# Patient Record
Sex: Female | Born: 1975 | Race: Black or African American | Hispanic: No | Smoking: Never smoker
Health system: Southern US, Community
[De-identification: ages and names within clinical notes are randomized; demographics above are authoritative.]

## PROBLEM LIST (undated history)

## (undated) DIAGNOSIS — E559 Vitamin D deficiency, unspecified: Secondary | ICD-10-CM

## (undated) DIAGNOSIS — I10 Essential (primary) hypertension: Secondary | ICD-10-CM

## (undated) DIAGNOSIS — D509 Iron deficiency anemia, unspecified: Secondary | ICD-10-CM

## (undated) DIAGNOSIS — B001 Herpesviral vesicular dermatitis: Secondary | ICD-10-CM

## (undated) HISTORY — DX: Vitamin D deficiency, unspecified: E55.9

## (undated) HISTORY — DX: Herpesviral vesicular dermatitis: B00.1

## (undated) HISTORY — DX: Iron deficiency anemia, unspecified: D50.9

## (undated) HISTORY — PX: ROOT CANAL: SHX2363

---

## 1997-12-23 HISTORY — PX: BREAST EXCISIONAL BIOPSY: SUR124

## 2011-10-08 DIAGNOSIS — B009 Herpesviral infection, unspecified: Secondary | ICD-10-CM | POA: Insufficient documentation

## 2012-10-22 DIAGNOSIS — D649 Anemia, unspecified: Secondary | ICD-10-CM | POA: Insufficient documentation

## 2012-11-02 DIAGNOSIS — N63 Unspecified lump in unspecified breast: Secondary | ICD-10-CM | POA: Insufficient documentation

## 2013-04-19 DIAGNOSIS — I10 Essential (primary) hypertension: Secondary | ICD-10-CM | POA: Insufficient documentation

## 2015-10-11 LAB — HM PAP SMEAR: HM PAP: NEGATIVE

## 2015-10-31 ENCOUNTER — Other Ambulatory Visit: Payer: Self-pay | Admitting: Obstetrics and Gynecology

## 2015-10-31 DIAGNOSIS — N6310 Unspecified lump in the right breast, unspecified quadrant: Secondary | ICD-10-CM

## 2015-11-01 ENCOUNTER — Other Ambulatory Visit: Payer: Self-pay | Admitting: *Deleted

## 2015-11-01 ENCOUNTER — Inpatient Hospital Stay
Admission: RE | Admit: 2015-11-01 | Discharge: 2015-11-01 | Disposition: A | Discharge status: Home / Self Care | Payer: Self-pay | Source: Ambulatory Visit | Attending: *Deleted | Admitting: *Deleted

## 2015-11-01 DIAGNOSIS — Z9289 Personal history of other medical treatment: Secondary | ICD-10-CM

## 2015-11-20 ENCOUNTER — Other Ambulatory Visit: Payer: Self-pay | Admitting: Obstetrics and Gynecology

## 2015-11-20 ENCOUNTER — Ambulatory Visit
Admission: RE | Admit: 2015-11-20 | Discharge: 2015-11-20 | Disposition: A | Discharge status: Home / Self Care | Payer: Managed Care, Other (non HMO) | Source: Ambulatory Visit | Attending: Obstetrics and Gynecology | Admitting: Obstetrics and Gynecology

## 2015-11-20 DIAGNOSIS — N63 Unspecified lump in breast: Secondary | ICD-10-CM | POA: Diagnosis present

## 2015-11-20 DIAGNOSIS — N6310 Unspecified lump in the right breast, unspecified quadrant: Secondary | ICD-10-CM

## 2015-11-21 ENCOUNTER — Other Ambulatory Visit: Payer: Self-pay | Admitting: Obstetrics and Gynecology

## 2015-11-21 DIAGNOSIS — N63 Unspecified lump in unspecified breast: Secondary | ICD-10-CM

## 2015-11-28 ENCOUNTER — Ambulatory Visit
Admission: RE | Admit: 2015-11-28 | Discharge: 2015-11-28 | Disposition: A | Discharge status: Home / Self Care | Payer: Managed Care, Other (non HMO) | Source: Ambulatory Visit | Attending: Obstetrics and Gynecology | Admitting: Obstetrics and Gynecology

## 2015-11-28 DIAGNOSIS — N63 Unspecified lump in unspecified breast: Secondary | ICD-10-CM

## 2015-11-28 HISTORY — PX: BREAST BIOPSY: SHX20

## 2015-11-29 LAB — SURGICAL PATHOLOGY

## 2015-12-05 ENCOUNTER — Other Ambulatory Visit: Payer: Self-pay | Admitting: Obstetrics and Gynecology

## 2015-12-05 DIAGNOSIS — N63 Unspecified lump in unspecified breast: Secondary | ICD-10-CM

## 2016-02-12 NOTE — Addendum Note (Signed)
Encounter addended by: Coral Spikes, Rad Tech on: 02/12/2016  4:50 PM<BR>     Documentation filed: Charges VN

## 2016-06-04 ENCOUNTER — Inpatient Hospital Stay: Admission: RE | Admit: 2016-06-04 | Payer: Managed Care, Other (non HMO) | Source: Ambulatory Visit

## 2016-06-04 ENCOUNTER — Ambulatory Visit: Admission: RE | Admit: 2016-06-04 | Payer: Managed Care, Other (non HMO) | Source: Ambulatory Visit

## 2016-06-24 ENCOUNTER — Emergency Department
Admission: EM | Admit: 2016-06-24 | Discharge: 2016-06-24 | Disposition: A | Discharge status: Home / Self Care | Payer: Managed Care, Other (non HMO) | Attending: Emergency Medicine | Admitting: Emergency Medicine

## 2016-06-24 ENCOUNTER — Encounter: Payer: Self-pay | Admitting: Emergency Medicine

## 2016-06-24 DIAGNOSIS — W010XXA Fall on same level from slipping, tripping and stumbling without subsequent striking against object, initial encounter: Secondary | ICD-10-CM | POA: Insufficient documentation

## 2016-06-24 DIAGNOSIS — Y9301 Activity, walking, marching and hiking: Secondary | ICD-10-CM | POA: Diagnosis not present

## 2016-06-24 DIAGNOSIS — W268XXA Contact with other sharp object(s), not elsewhere classified, initial encounter: Secondary | ICD-10-CM | POA: Insufficient documentation

## 2016-06-24 DIAGNOSIS — I10 Essential (primary) hypertension: Secondary | ICD-10-CM | POA: Insufficient documentation

## 2016-06-24 DIAGNOSIS — Y92009 Unspecified place in unspecified non-institutional (private) residence as the place of occurrence of the external cause: Secondary | ICD-10-CM | POA: Insufficient documentation

## 2016-06-24 DIAGNOSIS — S81812A Laceration without foreign body, left lower leg, initial encounter: Secondary | ICD-10-CM | POA: Diagnosis not present

## 2016-06-24 DIAGNOSIS — Y999 Unspecified external cause status: Secondary | ICD-10-CM | POA: Insufficient documentation

## 2016-06-24 HISTORY — DX: Essential (primary) hypertension: I10

## 2016-06-24 MED ORDER — CEPHALEXIN 500 MG PO CAPS: 500.0000 mg | ORAL_CAPSULE | Freq: Three times a day (TID) | ORAL | Status: AC

## 2016-06-24 MED ORDER — LIDOCAINE-EPINEPHRINE (PF) 1 %-1:200000 IJ SOLN
10.0000 mL | Freq: Once | INTRAMUSCULAR | Status: AC
  Administered 2016-06-24: 10 mL via INTRADERMAL
  Filled 2016-06-24: qty 30

## 2016-06-24 MED ORDER — TETANUS-DIPHTH-ACELL PERTUSSIS 5-2.5-18.5 LF-MCG/0.5 IM SUSP
0.5000 mL | Freq: Once | INTRAMUSCULAR | Status: AC
  Administered 2016-06-24: 0.5 mL via INTRAMUSCULAR
  Filled 2016-06-24: qty 0.5

## 2016-06-24 NOTE — ED Provider Notes (Signed)
CSN: [id number]     Arrival date & time 06/24/16  2031 History   First MD Initiated Contact with Patient 06/24/16 2146     No chief complaint on file.    (Consider location/radiation/quality/duration/timing/severity/associated sxs/prior Treatment) HPI  40 year old female presents to restart for evaluation of laceration to the left leg. Patient states just prior to arrival she was walking in her house when she cut her leg on the blinds. Her tetanus is not up-to-date. She has been ambulatory. Her pain is mild. Bleeding well controlled. She is not diabetic. She denies any numbness or tingling. She has not had any medications for pain.   Past Medical History  Diagnosis Date  . Hypertension    Past Surgical History  Procedure Laterality Date  . Breast excisional biopsy Left 1999    benign  . Breast biopsy Right 11/28/2015    path pending   Family History  Problem Relation Age of Onset  . Breast cancer Mother 29  . Breast cancer Maternal Aunt    Social History  Substance Use Topics  . Smoking status: Never Smoker   . Smokeless tobacco: Never Used  . Alcohol Use: No   OB History    No data available     Review of Systems  Constitutional: Negative for fever, chills, activity change and fatigue.  HENT: Negative for congestion, sinus pressure and sore throat.   Eyes: Negative for visual disturbance.  Respiratory: Negative for cough, chest tightness and shortness of breath.   Cardiovascular: Negative for chest pain and leg swelling.  Gastrointestinal: Negative for nausea, vomiting, abdominal pain and diarrhea.  Genitourinary: Negative for dysuria.  Musculoskeletal: Negative for arthralgias and gait problem.  Skin: Positive for wound. Negative for rash.  Neurological: Negative for weakness, numbness and headaches.  Hematological: Negative for adenopathy.  Psychiatric/Behavioral: Negative for behavioral problems, confusion and agitation.      Allergies  Review of patient's  allergies indicates no known allergies.  Home Medications   Prior to Admission medications   Medication Sig Start Date End Date Taking? Authorizing Provider  cephALEXin (KEFLEX) 500 MG capsule Take 1 capsule (500 mg total) by mouth 3 (three) times daily. 06/24/16 07/04/16  Duanne Guess, PA-C   BP 134/91 mmHg  Pulse 67  Temp(Src) 98.9 F (37.2 C) (Oral)  Ht 5\' 2"  (1.575 m)  Wt 89.359 kg  BMI 36.02 kg/m2  SpO2 99%  LMP 06/20/2016 Physical Exam  Constitutional: She is oriented to person, place, and time. She appears well-developed and well-nourished. No distress.  HENT:  Head: Normocephalic and atraumatic.  Mouth/Throat: Oropharynx is clear and moist.  Eyes: EOM are normal. Pupils are equal, round, and reactive to light. Right eye exhibits no discharge. Left eye exhibits no discharge.  Neck: Normal range of motion. Neck supple.  Cardiovascular: Normal rate, regular rhythm and intact distal pulses.   Pulmonary/Chest: Effort normal and breath sounds normal. No respiratory distress. She exhibits no tenderness.  Abdominal: Soft. She exhibits no distension. There is no tenderness.  Musculoskeletal: Normal range of motion. She exhibits no edema or tenderness.  Examination of the left leg shows the mid lateral tib-fib region there is a 5 cm laceration that is linear with no sign of foreign body. Wound is thoroughly irrigated. Wound margins well aligned. Patient has no tendon or neurological deficits in the left lower extremity. Sensation is intact.  Neurological: She is alert and oriented to person, place, and time. She has normal reflexes.  Skin: Skin is warm and  dry.  Psychiatric: She has a normal mood and affect. Her behavior is normal. Thought content normal.    ED Course  Procedures (including critical care time) LACERATION REPAIR Performed by: Feliberto Gottron Authorized by: Feliberto Gottron Consent: Verbal consent obtained. Risks and benefits: risks, benefits  and alternatives were discussed Consent given by: patient Patient identity confirmed: provided demographic data Prepped and Draped in normal sterile fashion Wound explored  Laceration Location: Left lateral leg  Laceration Length: 5 cm  No Foreign Bodies seen or palpated  Anesthesia: local infiltration  Local anesthetic: lidocaine 1% % with epinephrine  Anesthetic total: 4 ml  Irrigation method: syringe Amount of cleaning: standard  Skin closure: Simple interrupted   Number of sutures: 12   Technique: Simple interrupted 6-0 proline   Patient tolerance: Patient tolerated the procedure well with no immediate complications. Labs Review Labs Reviewed - No data to display  Imaging Review No results found. I have personally reviewed and evaluated these images and lab results as part of my medical decision-making.   EKG Interpretation None      MDM   Final diagnoses:  Leg laceration, left, initial encounter    40 year old female with left leg laceration. No tendon or neurological deficits. No sign of foreign body. Tetanus was updated in the emergency department, laceration was thoroughly irrigated and repaired with #12 6-0 Prolene sutures. Keflex for antibiotic prophylaxis. She will keep clean and dry. Follow-up in 8-10 days for suture removal.    Duanne Guess, PA-C 06/24/16 2254  Orbie Pyo, MD 06/25/16 [id number]

## 2016-06-24 NOTE — Discharge Instructions (Signed)
Laceration Care, Adult  A laceration is a cut that goes through all layers of the skin. The cut also goes into the tissue that is right under the skin. Some cuts heal on their own. Others need to be closed with stitches (sutures), staples, skin adhesive strips, or wound glue. Taking care of your cut lowers your risk of infection and helps your cut to heal better.  HOW TO TAKE CARE OF YOUR CUT  For stitches or staples:  · Keep the wound clean and dry.  · If you were given a bandage (dressing), you should change it at least one time per day or as told by your doctor. You should also change it if it gets wet or dirty.  · Keep the wound completely dry for the first 24 hours or as told by your doctor. After that time, you may take a shower or a bath. However, make sure that the wound is not soaked in water until after the stitches or staples have been removed.  · Clean the wound one time each day or as told by your doctor:    Wash the wound with soap and water.    Rinse the wound with water until all of the soap comes off.    Pat the wound dry with a clean towel. Do not rub the wound.  · After you clean the wound, put a thin layer of antibiotic ointment on it as told by your doctor. This ointment:    Helps to prevent infection.    Keeps the bandage from sticking to the wound.  · Have your stitches or staples removed as told by your doctor.  If your doctor used skin adhesive strips:   · Keep the wound clean and dry.  · If you were given a bandage, you should change it at least one time per day or as told by your doctor. You should also change it if it gets dirty or wet.  · Do not get the skin adhesive strips wet. You can take a shower or a bath, but be careful to keep the wound dry.  · If the wound gets wet, pat it dry with a clean towel. Do not rub the wound.  · Skin adhesive strips fall off on their own. You can trim the strips as the wound heals. Do not remove any strips that are still stuck to the wound. They will  fall off after a while.  If your doctor used wound glue:  · Try to keep your wound dry, but you may briefly wet it in the shower or bath. Do not soak the wound in water, such as by swimming.  · After you take a shower or a bath, gently pat the wound dry with a clean towel. Do not rub the wound.  · Do not do any activities that will make you really sweaty until the skin glue has fallen off on its own.  · Do not apply liquid, cream, or ointment medicine to your wound while the skin glue is still on.  · If you were given a bandage, you should change it at least one time per day or as told by your doctor. You should also change it if it gets dirty or wet.  · If a bandage is placed over the wound, do not let the tape for the bandage touch the skin glue.  · Do not pick at the glue. The skin glue usually stays on for 5-10 days. Then, it   or when wound glue stays in place and the wound is healed. Make sure to wear a sunscreen of at least 30 SPF.  Take over-the-counter and prescription medicines only as told by your doctor.  If you were given antibiotic medicine or ointment, take or apply it as told by your doctor. Do not stop using the antibiotic even if your wound is getting better.  Do not scratch or pick at the wound.  Keep all follow-up visits as told by your doctor. This is important.  Check your wound every day for signs of infection. Watch for:  Redness, swelling, or pain.  Fluid, blood, or pus.  Raise (elevate) the injured area above the level of your heart while you are sitting or lying down, if possible. GET HELP IF:  You got a tetanus shot and you have any of these problems at the injection site:  Swelling.  Very bad pain.  Redness.  Bleeding.  You have a fever.  A wound that was  closed breaks open.  You notice a bad smell coming from your wound or your bandage.  You notice something coming out of the wound, such as wood or glass.  Medicine does not help your pain.  You have more redness, swelling, or pain at the site of your wound.  You have fluid, blood, or pus coming from your wound.  You notice a change in the color of your skin near your wound.  You need to change the bandage often because fluid, blood, or pus is coming from the wound.  You start to have a new rash.  You start to have numbness around the wound. GET HELP RIGHT AWAY IF:  You have very bad swelling around the wound.  Your pain suddenly gets worse and is very bad.  You notice painful lumps near the wound or on skin that is anywhere on your body.  You have a red streak going away from your wound.  The wound is on your hand or foot and you cannot move a finger or toe like you usually can.  The wound is on your hand or foot and you notice that your fingers or toes look pale or bluish.   This information is not intended to replace advice given to you by your health care provider. Make sure you discuss any questions you have with your health care provider.   Document Released: 05/27/2008 Document Revised: 04/25/2015 Document Reviewed: 12/05/2014 Elsevier Interactive Patient Education 2016 [employer]. Please follow-up with the walk-in clinic and 810 days for suture removal. Please keep area clean and dry. Clean with alcohol or half peroxide and water daily. Return to the ER for any warmth erythema or drainage.

## 2016-06-24 NOTE — ED Notes (Signed)
-  pt with approx 3 cm laceation with controlled bleeding to left lateral mid lower leg. Pt states she thinks miniblinds with exposed metal lacerated her leg, dressing intact.

## 2016-06-24 NOTE — ED Notes (Signed)
Pt reports she was placing laundry up in her childs room and tripped and fell into the metal door framing where the blinds lock in place causing and approx 3 1/2 inch laceration to bottom outer aspect of left leg - Unknown tetanus shot status - Bleeding is controlled at this time - Lac involves some fatty tissue

## 2016-12-20 ENCOUNTER — Other Ambulatory Visit: Payer: Self-pay | Admitting: Obstetrics and Gynecology

## 2016-12-20 DIAGNOSIS — N63 Unspecified lump in unspecified breast: Secondary | ICD-10-CM

## 2017-01-10 ENCOUNTER — Ambulatory Visit
Admission: RE | Admit: 2017-01-10 | Discharge: 2017-01-10 | Disposition: A | Discharge status: Home / Self Care | Payer: Managed Care, Other (non HMO) | Source: Ambulatory Visit | Attending: Obstetrics and Gynecology | Admitting: Obstetrics and Gynecology

## 2017-01-10 DIAGNOSIS — N63 Unspecified lump in unspecified breast: Secondary | ICD-10-CM

## 2017-01-10 DIAGNOSIS — D241 Benign neoplasm of right breast: Secondary | ICD-10-CM | POA: Insufficient documentation

## 2017-01-10 DIAGNOSIS — N6312 Unspecified lump in the right breast, upper inner quadrant: Secondary | ICD-10-CM | POA: Diagnosis present

## 2017-01-10 LAB — HM MAMMOGRAPHY

## 2017-01-23 HISTORY — PX: WISDOM TOOTH EXTRACTION: SHX21

## 2017-02-22 IMAGING — MG US  BREAST BX W/ LOC DEV 1ST LESION IMG BX SPEC US GUIDE*R*
1 series · 8 of 8 positions shown · non-contrast
Comparison: Previous exam(s).

ADDENDUM:
Pathology of the BREAST, RIGHT UPPER INNER QUADRANT; CORE BIOPSY:
FIBROADENOMA WITH USUAL DUCTAL HYPERPLASIA. This was found to be
concordant by Dr. Golden.

Pathology of the BREAST, RIGHT AXILLA; CORE BIOPSY: FIBROADENOMA
WITH FOCAL USUAL DUCTAL HYPERPLASIA. This was found to be concordant
by Dr. Golden, and the diagnosis indicates that the mass is indeed
in the axillary tail of the breast rather than the axilla.
RECOMMENDATION: Recommend a diagnostic LEFT mammogram and US in 6
months to confirm stability of other likely benign findings on
diagnostic mammogram and ultrasound.
At the patient's request, pathology and recommendations were relayed
to the patient by phone. She stated she has done well following the
biopsy with no bleeding, bruising, or palpable hematoma at the
biopsy site. All of her questions were answered. She was encouraged
to call the [REDACTED]
Pathology and recommendations relayed by Bolduan, Armuu Jacaylku on
11/30/15.
CLINICAL DATA: 39-year-old with a palpable lump in the upper inner
right breast shown on recent diagnostic workup to represent an
approximate 1.7 cm solid mass with slightly vague irregular margins.
An approximate 2 cm right axillary lymph node or mass in the
axillary tail of the right breast was also identified at that time.
Biopsy of both masses was recommended.
EXAM:
ULTRASOUND-GUIDED RIGHT BREAST CORE NEEDLE BIOPSY
ULTRASOUND-GUIDED CORE NEEDLE BIOPSY OF A RIGHT AXILLARY NODE OR
MASS

[Series 1: MG view · 0.06mm/px · 8 of 19 slices shown]
[im 1/19]
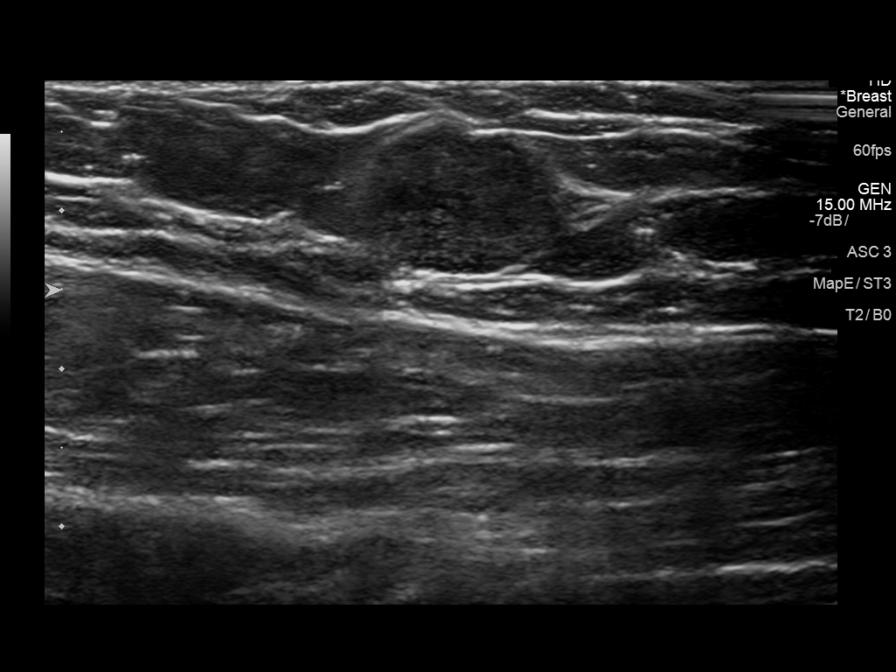
[im 3/19]
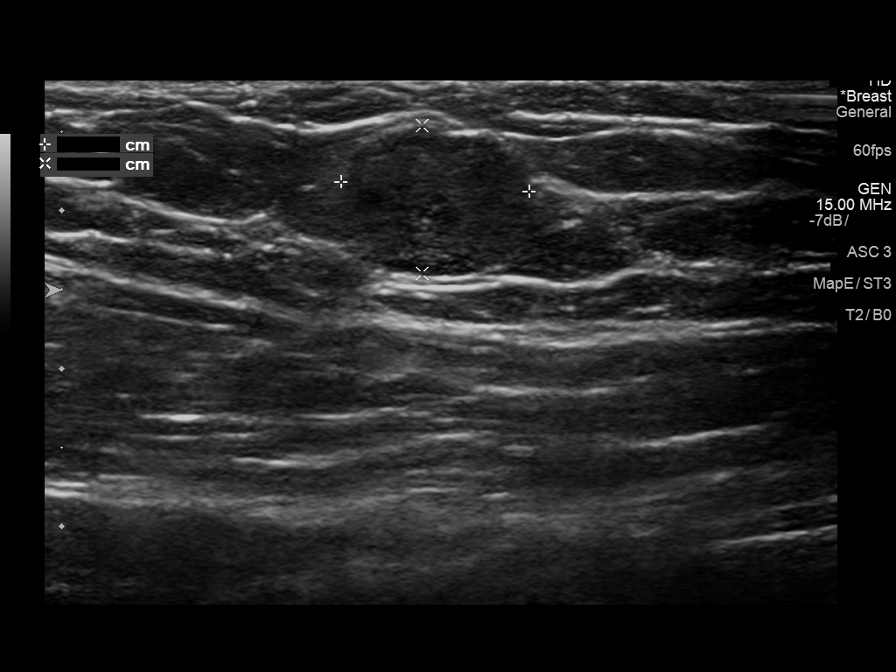
[im 6/19]
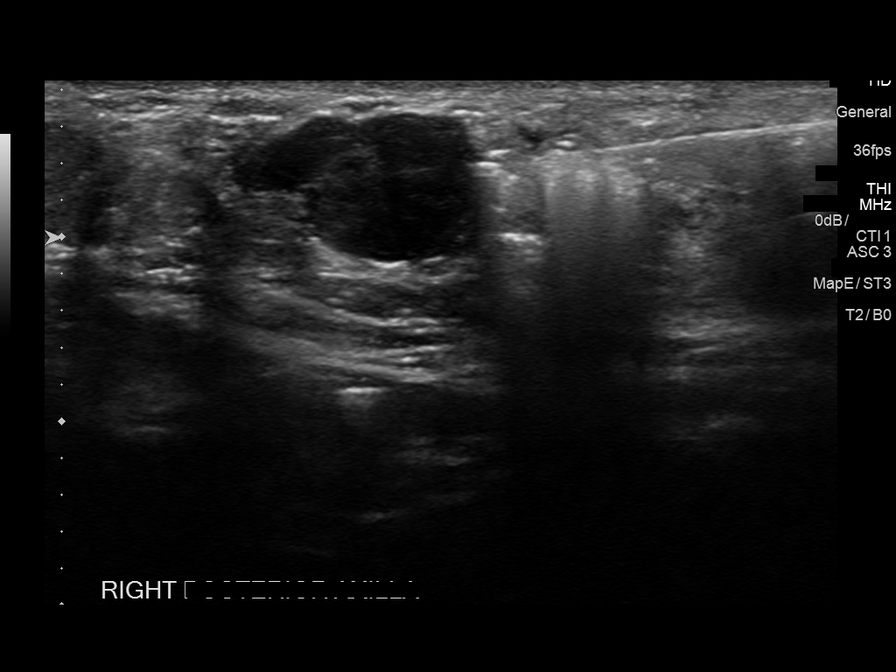
[im 8/19]
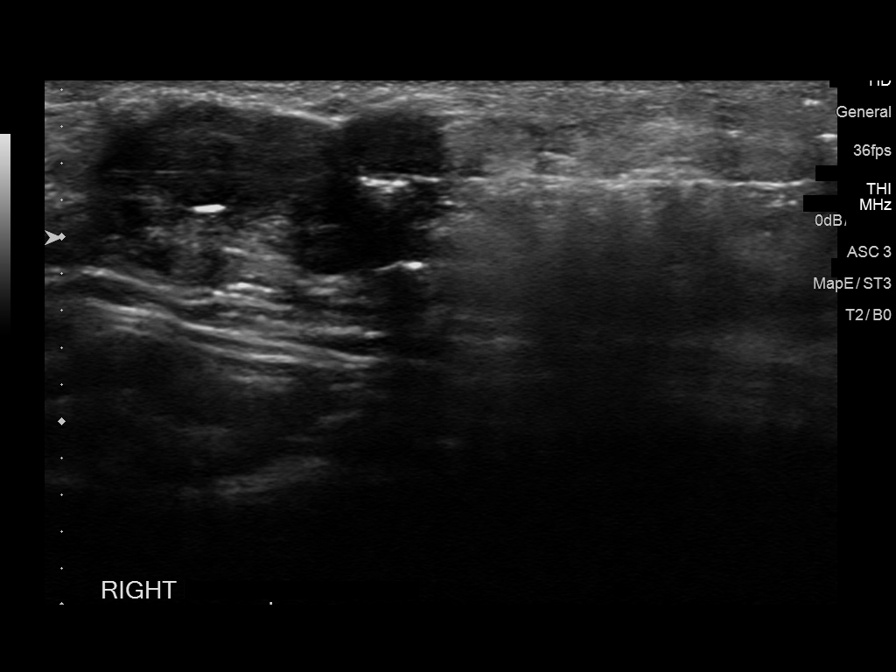
[im 11/19]
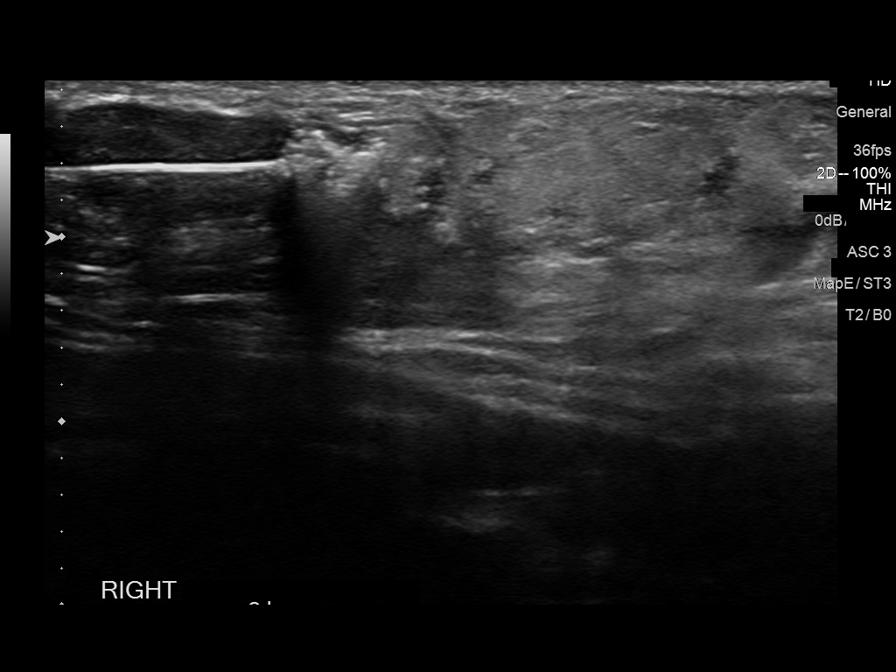
[im 13/19]
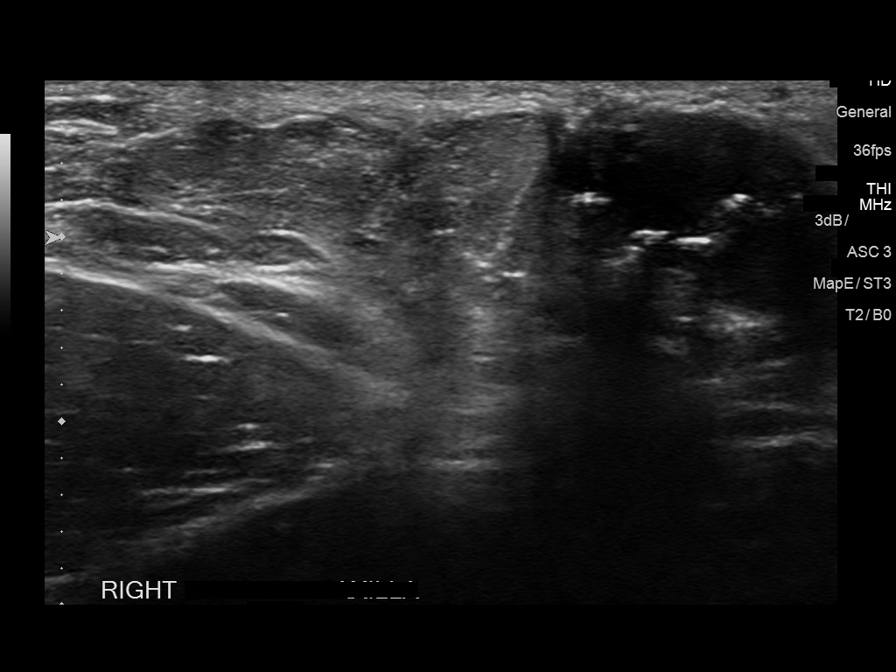
[im 16/19]
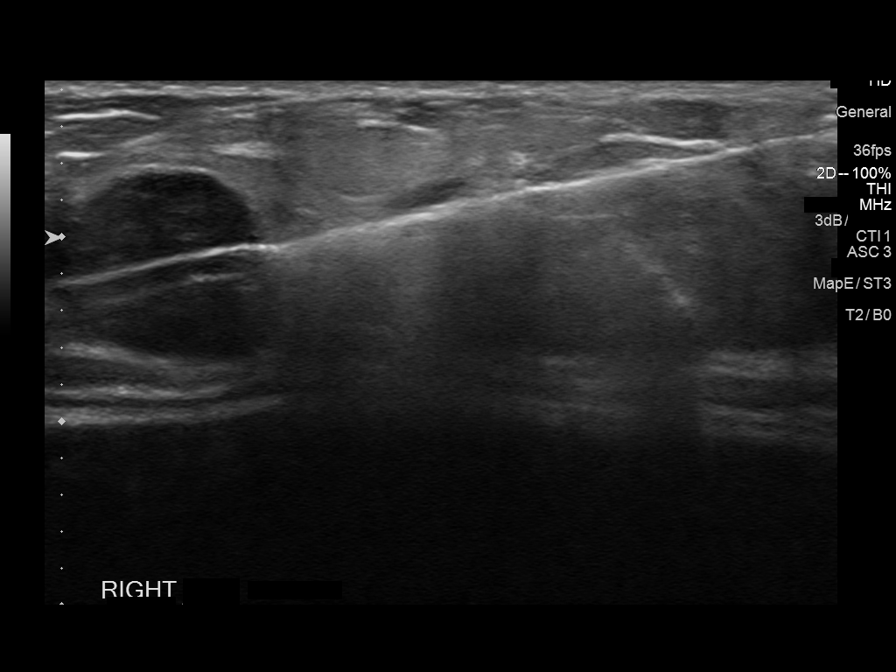
[im 19/19]
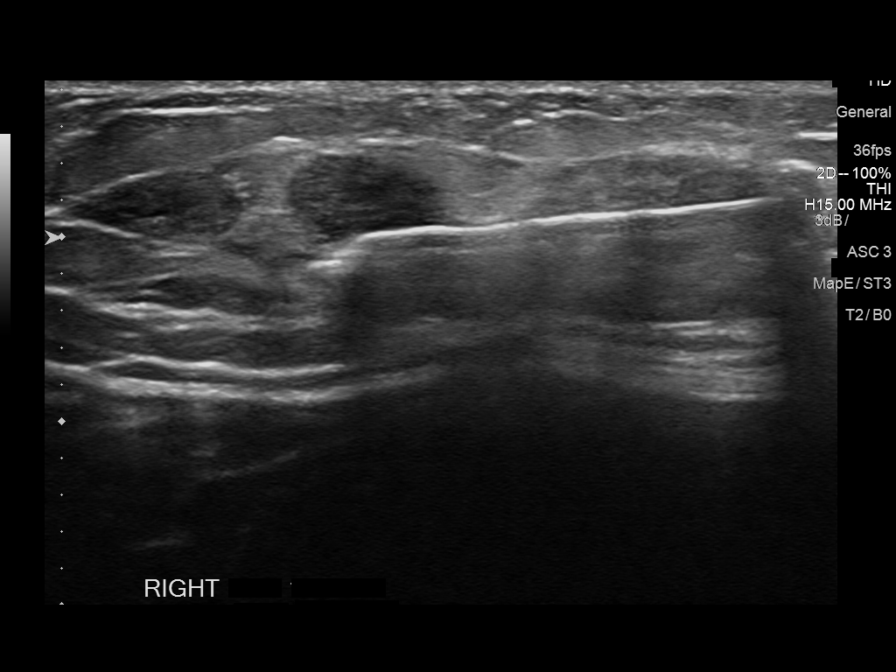

[8 of 8 positions shown; findings below may reference images not displayed]



Initially, using sterile technique and 2% Lidocaine as local
anesthetic, under direct ultrasound visualization, a 14 gauge
Achieve core needle device placed through a 13 gauge introducer
needle was used to perform biopsy of the right axillary lymph node
versus mass in the axillary tail of the right breast using an
inferior approach. At the conclusion of the procedure, a coil shaped
tissue marker clip was deployed into the biopsy cavity.

Next, using sterile technique and 2% lidocaine as local anesthetic,
under direct ultrasound visualization, a separate 14 gauge achieve
core needle device placed through a 13 gauge introducer needle was
used to perform biopsy of the 1.7 cm mass in the upper inner right
breast at the 12:30 o'clock position approximately 10 cm from the
nipple using an inferior approach. At the conclusion of the
procedure, a ribbon shaped tissue marker clip was deployed into the
biopsy cavity.

Follow up 2 view mammogram was performed and dictated separately.
IMPRESSION: Ultrasound guided biopsy of an indeterminate 1.7 cm mass in the
upper inner quadrant of the right breast and an indeterminate 2.0 cm
right axillary lymph node versus mass in the axillary tail of the
right breast. No apparent complications.

## 2017-09-02 ENCOUNTER — Other Ambulatory Visit: Payer: Self-pay | Admitting: Obstetrics and Gynecology

## 2017-09-10 ENCOUNTER — Ambulatory Visit: Payer: Self-pay | Admitting: Obstetrics and Gynecology

## 2017-09-11 ENCOUNTER — Other Ambulatory Visit: Payer: Self-pay

## 2017-09-11 ENCOUNTER — Encounter: Payer: Self-pay | Admitting: Family Medicine

## 2017-09-11 ENCOUNTER — Ambulatory Visit (INDEPENDENT_AMBULATORY_CARE_PROVIDER_SITE_OTHER): Payer: Managed Care, Other (non HMO) | Admitting: Family Medicine

## 2017-09-11 VITALS — BP 130/92 | HR 76 | Temp 98.1°F | Resp 16 | Ht 61.5 in | Wt 197.0 lb

## 2017-09-11 DIAGNOSIS — Z7689 Persons encountering health services in other specified circumstances: Secondary | ICD-10-CM

## 2017-09-11 DIAGNOSIS — N920 Excessive and frequent menstruation with regular cycle: Secondary | ICD-10-CM | POA: Diagnosis not present

## 2017-09-11 DIAGNOSIS — D500 Iron deficiency anemia secondary to blood loss (chronic): Secondary | ICD-10-CM | POA: Diagnosis not present

## 2017-09-11 DIAGNOSIS — E559 Vitamin D deficiency, unspecified: Secondary | ICD-10-CM

## 2017-09-11 DIAGNOSIS — D509 Iron deficiency anemia, unspecified: Secondary | ICD-10-CM | POA: Insufficient documentation

## 2017-09-11 DIAGNOSIS — R252 Cramp and spasm: Secondary | ICD-10-CM | POA: Diagnosis not present

## 2017-09-11 DIAGNOSIS — Z803 Family history of malignant neoplasm of breast: Secondary | ICD-10-CM | POA: Diagnosis not present

## 2017-09-11 DIAGNOSIS — M533 Sacrococcygeal disorders, not elsewhere classified: Secondary | ICD-10-CM | POA: Diagnosis not present

## 2017-09-11 DIAGNOSIS — E669 Obesity, unspecified: Secondary | ICD-10-CM | POA: Insufficient documentation

## 2017-09-11 DIAGNOSIS — Z6836 Body mass index (BMI) 36.0-36.9, adult: Secondary | ICD-10-CM | POA: Diagnosis not present

## 2017-09-11 DIAGNOSIS — I10 Essential (primary) hypertension: Secondary | ICD-10-CM | POA: Diagnosis not present

## 2017-09-11 MED ORDER — HYDROCHLOROTHIAZIDE 25 MG PO TABS: 25.0000 mg | ORAL_TABLET | Freq: Every day | ORAL | 3 refills | Status: DC

## 2017-09-11 NOTE — Assessment & Plan Note (Signed)
Uncontrolled today, but may have some component of whitecoat hypertension Patient reports that home blood pressures are well controlled Continue HCTZ 25 mg daily Discussed that cramping may be related to HCTZ and discuss other options for antihypertensives, including amlodipine Patient prefers to continue her current medication at this time Of note, she is allergic to lisinopril and had angioedema, so ACE inhibitor and ARB should be avoided in the future Follow-up in 6 months Check CMP

## 2017-09-11 NOTE — Assessment & Plan Note (Signed)
Discussed diet and exercise Check A1c, lipid panel

## 2017-09-11 NOTE — Patient Instructions (Addendum)
BRCA Gene Testing Why am I having this test? BRCA gene testing is done to check for the presence of harmful changes (mutations) in the BRCA1 gene or the BRCA2 gene (breast cancer susceptibility genes). If there is a mutation, the genes may not be able to help repair damaged cells in the body. As a result, the damaged cells may develop defects that can lead to certain types of cancer. You may have this test if you have a family history of certain types of cancer, including cancer of the:  Breast.  Ovaries.  Fallopian tubes.  Peritoneum.  Pancreas.  Prostate.  What kind of sample is taken? The test requires either a sample of blood or a sample of cells from your saliva. If a sample of blood is needed, it will probably be collected by inserting a needle into a vein. If a sample of saliva is needed, you will get instructions about how to collect the sample. What do the results mean? The test results can show whether you have a mutation in the BRCA1 or BRCA2 gene that increases your risk for certain cancers. Meaning of negative test results A negative test result means that you do not have a mutation in the BRCA1 or BRCA2 gene that is known to increase your risk for certain cancers. This does not mean that you will never get cancer. Talk with your health care provider or a genetic counselor about what this result means for you. Meaning of positive test results A positive test result means that you have a mutation in the BRCA1 or BRCA2 gene that increases your risk for certain cancers. Women with a positive test result have an increased risk for breast and ovarian cancer. Both women and men with a mutation have an increased risk for breast cancer and may be at greater risk for other types of cancer. Getting a positive test result does not mean that you will develop cancer. Talk with your health care provider or a genetic counselor about what this result means for you. You may be told that you are  a carrier. This means that you can pass the mutation to your children. Meaning of ambiguous test results Ambiguous, inconclusive, or uncertain test results mean that there is a change in the BRCA1 or BRCA2 gene, but it is a change that has not been linked to cancer. Talk with your health care provider or a genetic counselor about what this result means for you. Talk with your health care provider to discuss your results, treatment options, and if necessary, the need for more tests. Talk with your health care provider if you have any questions about your results. How do I get my results? It is up to you to get your test results. Ask your health care provider, or the department that is doing the test, when your results will be ready. This information is not intended to replace advice given to you by your health care provider. Make sure you discuss any questions you have with your health care provider. Document Released: 01/02/2005 Document Revised: 08/12/2016 Document Reviewed: 07/31/2016 Elsevier Interactive Patient Education  2018 [employer].    Intrauterine Device Information An intrauterine device (IUD) is inserted into your uterus to prevent pregnancy. There are two types of IUDs available:  Copper IUD-This type of IUD is wrapped in copper wire and is placed inside the uterus. Copper makes the uterus and fallopian tubes produce a fluid that kills sperm. The copper IUD can stay in place for  10 years.  Hormone IUD-This type of IUD contains the hormone progestin (synthetic progesterone). The hormone thickens the cervical mucus and prevents sperm from entering the uterus. It also thins the uterine lining to prevent implantation of a fertilized egg. The hormone can weaken or kill the sperm that get into the uterus. One type of hormone IUD can stay in place for 5 years, and another type can stay in place for 3 years.  Your health care provider will make sure you are a good candidate for a  contraceptive IUD. Discuss with your health care provider the possible side effects. Advantages of an intrauterine device  IUDs are highly effective, reversible, long acting, and low maintenance.  There are no estrogen-related side effects.  An IUD can be used when breastfeeding.  IUDs are not associated with weight gain.  The copper IUD works immediately after insertion.  The hormone IUD works right away if inserted within 7 days of your period starting. You will need to use a backup method of birth control for 7 days if the hormone IUD is inserted at any other time in your cycle.  The copper IUD does not interfere with your female hormones.  The hormone IUD can make heavy menstrual periods lighter and decrease cramping.  The hormone IUD can be used for 3 or 5 years.  The copper IUD can be used for 10 years. Disadvantages of an intrauterine device  The hormone IUD can be associated with irregular bleeding patterns.  The copper IUD can make your menstrual flow heavier and more painful.  You may experience cramping and vaginal bleeding after insertion. This information is not intended to replace advice given to you by your health care provider. Make sure you discuss any questions you have with your health care provider. Document Released: 11/12/2004 Document Revised: 05/16/2016 Document Reviewed: 05/30/2013 Elsevier Interactive Patient Education  2017 [employer].

## 2017-09-11 NOTE — Assessment & Plan Note (Signed)
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

## 2017-09-11 NOTE — Progress Notes (Signed)
Patient: Martha Mendoza Female    DOB: 1976-07-06   41 y.o.   MRN: [id number] Visit Date: 09/11/2017  Today's Provider: Lavon Paganini, MD   Chief Complaint  Patient presents with  . Establish Care  . Hypertension   Subjective:    HPI   Bland presents to establish care. She has a [name], vitamin D deficiency, fever blisters and hypertension.   She checks her BP at home, and they are about 110's/70"s. She does admit to white coat syndrome. She is taking HCTZ 25 mg po qd for the HTN. She reports good compliance (she is taking only 1/2 currently because she was afraid of running out before establishing with new PCP). She reports good tolerance of medication. She reports the medication "dries me out", and that causes arthralgias.  She was seeing [facility] for her primary care. She states she has never had an abnormal pap. She gets her flu vaccine yearly through work.  Menorrhagia: Last 5-7 days, filling diva cup 3-4 times daily, q23d.  Has h/o iron def anemia.  Taking iron supplement 3-4 times weekly.  Hasn't thought about management with contraception as she still contemplates having another child  Family history of breast cancer in mother and maternal aunt at young age, maternal uncle with prostate cancer.  Has never discussed BRCA testing with anyone.  She has had benign biopsies from both breasts.  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  Worse with walking up stairs. Feels like sciatica that she had when pregnant. No radiation. Intermittent. No weakness, numbness.  Allergies  Allergen Reactions  . Lisinopril Swelling    angioedema     Current Outpatient Prescriptions:  .  Cholecalciferol (VITAMIN D-3) 5000 units TABS, Take by mouth., Disp: , Rfl:  .  ferrous sulfate 325 (65 FE) MG EC tablet, Take 325 mg by  mouth daily with breakfast. , Disp: , Rfl:  .  hydrochlorothiazide (HYDRODIURIL) 25 MG tablet, Take 25 mg by mouth daily., Disp: , Rfl:  .  Multiple Vitamin (MULTIVITAMIN) tablet, Take 1 tablet by mouth daily., Disp: , Rfl:  .  valACYclovir (VALTREX) 1000 MG tablet, Take 1,000 mg by mouth 2 (two) times daily as needed., Disp: , Rfl:   Review of Systems  Constitutional: Negative.   HENT: Negative.   Eyes: Positive for discharge and redness. Negative for photophobia, pain, itching and visual disturbance.  Respiratory: Negative.   Cardiovascular: Negative.   Gastrointestinal: Positive for abdominal distention. Negative for abdominal pain, anal bleeding, blood in stool, constipation, diarrhea, nausea, rectal pain and vomiting.  Endocrine: Negative.   Genitourinary: Negative.   Musculoskeletal: Positive for arthralgias. Negative for back pain, gait problem, joint swelling, myalgias, neck pain and neck stiffness.  Skin: Negative.   Allergic/Immunologic: Negative.   Neurological: Negative.   Hematological: Negative.   Psychiatric/Behavioral: Negative.     Past Medical History:  Diagnosis Date  . Fever blister   . Hypertension   . Iron deficiency anemia   . Vitamin D deficiency     Past Surgical History:  Procedure Laterality Date  . BREAST BIOPSY Right 11/28/2015   FIBROADENOMA WITH USUAL DUCTAL HYPERPLASIA  . BREAST EXCISIONAL BIOPSY Left 1999   benign  . ROOT CANAL    . WISDOM TOOTH EXTRACTION  01/2017   x4  Family History  Problem Relation Age of Onset  . Breast cancer Mother 45       metastatic  . Stroke Mother 55  . Hypertension Mother   . Diabetes Mother   . Breast cancer Maternal Aunt        unknown age  . Hypertension Sister   . Hypertension Brother   . Healthy Brother   . Prostate cancer Maternal Uncle      Social History  Substance Use Topics  . Smoking status: Never Smoker  . Smokeless tobacco: Never Used  . Alcohol use No   Objective:   BP (!)  130/92 (BP Location: Left Arm, Patient Position: Sitting, Cuff Size: Large)   Pulse 76   Temp 98.1 F (36.7 C) (Oral)   Resp 16   Ht 5' 1.5" (1.562 m)   Wt 197 lb (89.4 kg)   LMP 08/26/2017   BMI 36.62 kg/m  Vitals:   09/11/17 1414  BP: (!) 130/92  Pulse: 76  Resp: 16  Temp: 98.1 F (36.7 C)  TempSrc: Oral  Weight: 197 lb (89.4 kg)  Height: 5' 1.5" (1.562 m)     Physical Exam  Constitutional: She is oriented to person, place, and time. She appears well-developed and well-nourished. No distress.  HENT:  Head: Normocephalic and atraumatic.  Right Ear: External ear normal.  Left Ear: External ear normal.  Mouth/Throat: Oropharynx is clear and moist.  Eyes: Conjunctivae are normal. No scleral icterus.  Neck: Neck supple. No thyromegaly present.  Cardiovascular: Normal rate, regular rhythm, normal heart sounds and intact distal pulses.   No murmur heard. Pulmonary/Chest: Effort normal. No respiratory distress. She has no wheezes. She has no rales.  Abdominal: Soft. Bowel sounds are normal. She exhibits no distension. There is no tenderness. There is no rebound and no guarding.  Musculoskeletal: She exhibits no edema or deformity.  Back: No TTP over greater trochanter or SI joint, neg SLR, strength intact in LEs  Lymphadenopathy:    She has no cervical adenopathy.  Neurological: She is alert and oriented to person, place, and time.  Skin: Skin is warm and dry. No rash noted.  Psychiatric: She has a normal mood and affect. Her behavior is normal.  Vitals reviewed.       Assessment & Plan:      Problem List Items Addressed This Visit      Cardiovascular and Mediastinum   Hypertension    Uncontrolled today, but may have some component of whitecoat hypertension Patient reports that home blood pressures are well controlled Continue HCTZ 25 mg daily Discussed that cramping may be related to HCTZ and discuss other options for antihypertensives, including  amlodipine Patient prefers to continue her current medication at this time Of note, she is allergic to lisinopril and had angioedema, so ACE inhibitor and ARB should be avoided in the future Follow-up in 6 months Check CMP      Relevant Medications   hydrochlorothiazide (HYDRODIURIL) 25 MG tablet   Other Relevant Orders   CMP14+EGFR   Magnesium   Lipid panel   Hemoglobin A1c     Musculoskeletal and Integument   SI (sacroiliac) joint dysfunction    Pain seems related to SI joint dysfunction No evidence of sciatica on exam today Advised for strengthening to take pressure off SI joint        Other   Vitamin D deficiency    Previously diagnosed with vitamin D deficiency No previous lab levels available Taking daily supplementation intermittently  recheck vitamin D level today      Relevant Orders   VITAMIN D 25 Hydroxy (Vit-D Deficiency, Fractures)   Iron deficiency anemia    Likely related to menorrhagia Check CBC, iron panel May need to increase iron supplementation to at least daily pending labs      Relevant Medications   ferrous sulfate 325 (65 FE) MG EC tablet   Other Relevant Orders   CBC w/Diff/Platelet   Fe+TIBC+Fer   Family history of breast cancer    Seemingly strong family history of early-onset aggressive breast cancer Patient herself has already required to breast biopsies which have been benign Discussed option of BRCA gene testing with patient she will consider We will continue regular mammography Follow-up at CPE in 6 months      Class 2 obesity without serious comorbidity with body mass index (BMI) of 36.0 to 36.9 in adult    Discussed diet and exercise Check A1c, lipid panel      Relevant Orders   Lipid panel   Hemoglobin A1c   Leg cramping    Could be related to HCTZ Advised adequate hydration Advised stretching before bed Check CMP, magnesium levels      Menorrhagia    Chronic problems Advised on possibility of having IUD to help  decrease bleeding with periods This would also help with iron deficiency anemia Patient will consider, but she may be considering having another child instead       Other Visit Diagnoses    Encounter to establish care    -  Primary          The entirety of the information documented in the History of Present Illness, Review of Systems and Physical Exam were personally obtained by me. Portions of this information were initially documented by Raquel Sarna Ratchford, CMA and reviewed by me for thoroughness and accuracy.     Martha Paganini, MD  [facility]

## 2017-09-11 NOTE — Assessment & Plan Note (Signed)
Seemingly strong family history of early-onset aggressive breast cancer Patient herself has already required to breast biopsies which have been benign Discussed option of BRCA gene testing with patient she will consider We will continue regular mammography Follow-up at CPE in 6 months

## 2017-09-11 NOTE — Assessment & Plan Note (Signed)
Could be related to HCTZ Advised adequate hydration Advised stretching before bed Check CMP, magnesium levels

## 2017-09-11 NOTE — Assessment & Plan Note (Signed)
Previously diagnosed with vitamin D deficiency No previous lab levels available Taking daily supplementation intermittently  recheck vitamin D level today

## 2017-09-11 NOTE — Assessment & Plan Note (Signed)
Likely related to menorrhagia Check CBC, iron panel May need to increase iron supplementation to at least daily pending labs

## 2017-09-11 NOTE — Assessment & Plan Note (Signed)
Chronic problems Advised on possibility of having IUD to help decrease bleeding with periods This would also help with iron deficiency anemia Patient will consider, but she may be considering having another child instead

## 2017-09-12 ENCOUNTER — Telehealth: Payer: Self-pay

## 2017-09-12 LAB — IRON,TIBC AND FERRITIN PANEL
FERRITIN: 29 ng/mL (ref 15–150)
IRON: 78 ug/dL (ref 27–159)
Iron Saturation: 20 % (ref 15–55)
Total Iron Binding Capacity: 398 ug/dL (ref 250–450)
UIBC: 320 ug/dL (ref 131–425)

## 2017-09-12 LAB — CBC WITH DIFFERENTIAL/PLATELET
BASOS: 0 %
Basophils Absolute: 0 10*3/uL (ref 0.0–0.2)
EOS (ABSOLUTE): 0.2 10*3/uL (ref 0.0–0.4)
EOS: 3 %
HEMATOCRIT: 39.6 % (ref 34.0–46.6)
Hemoglobin: 12.7 g/dL (ref 11.1–15.9)
Immature Grans (Abs): 0 10*3/uL (ref 0.0–0.1)
Immature Granulocytes: 0 %
LYMPHS ABS: 2.5 10*3/uL (ref 0.7–3.1)
Lymphs: 36 %
MCH: 20.5 pg — ABNORMAL LOW (ref 26.6–33.0)
MCHC: 32.1 g/dL (ref 31.5–35.7)
MCV: 64 fL — AB (ref 79–97)
MONOS ABS: 0.6 10*3/uL (ref 0.1–0.9)
Monocytes: 8 %
Neutrophils Absolute: 3.6 10*3/uL (ref 1.4–7.0)
Neutrophils: 53 %
Platelets: 317 10*3/uL (ref 150–379)
RBC: 6.2 x10E6/uL — ABNORMAL HIGH (ref 3.77–5.28)
RDW: 16.2 % — AB (ref 12.3–15.4)
WBC: 7 10*3/uL (ref 3.4–10.8)

## 2017-09-12 LAB — CMP14+EGFR
A/G RATIO: 1.4 (ref 1.2–2.2)
ALT: 10 IU/L (ref 0–32)
AST: 14 IU/L (ref 0–40)
Albumin: 4.4 g/dL (ref 3.5–5.5)
Alkaline Phosphatase: 68 IU/L (ref 39–117)
BUN/Creatinine Ratio: 9 (ref 9–23)
BUN: 8 mg/dL (ref 6–24)
Bilirubin Total: 0.3 mg/dL (ref 0.0–1.2)
CALCIUM: 9.7 mg/dL (ref 8.7–10.2)
CO2: 25 mmol/L (ref 20–29)
Chloride: 100 mmol/L (ref 96–106)
Creatinine, Ser: 0.86 mg/dL (ref 0.57–1.00)
GFR, EST AFRICAN AMERICAN: 96 mL/min/{1.73_m2} (ref >59)
GFR, EST NON AFRICAN AMERICAN: 84 mL/min/{1.73_m2} (ref >59)
Globulin, Total: 3.2 g/dL (ref 1.5–4.5)
Glucose: 85 mg/dL (ref 65–99)
Potassium: 3.9 mmol/L (ref 3.5–5.2)
Sodium: 136 mmol/L (ref 134–144)
TOTAL PROTEIN: 7.6 g/dL (ref 6.0–8.5)

## 2017-09-12 LAB — MAGNESIUM: Magnesium: 1.9 mg/dL (ref 1.6–2.3)

## 2017-09-12 LAB — LIPID PANEL
CHOL/HDL RATIO: 2.7 ratio (ref 0.0–4.4)
Cholesterol, Total: 178 mg/dL (ref 100–199)
HDL: 67 mg/dL (ref >39)
LDL CALC: 88 mg/dL (ref 0–99)
TRIGLYCERIDES: 113 mg/dL (ref 0–149)
VLDL CHOLESTEROL CAL: 23 mg/dL (ref 5–40)

## 2017-09-12 LAB — HEMOGLOBIN A1C
Est. average glucose Bld gHb Est-mCnc: 126 mg/dL
Hgb A1c MFr Bld: 6 % — ABNORMAL HIGH (ref 4.8–5.6)

## 2017-09-12 LAB — VITAMIN D 25 HYDROXY (VIT D DEFICIENCY, FRACTURES): VIT D 25 HYDROXY: 24.2 ng/mL — AB (ref 30.0–100.0)

## 2017-09-12 NOTE — Telephone Encounter (Signed)
Pt advised and verbalizes understanding

## 2017-09-12 NOTE — Telephone Encounter (Signed)
lmtcb

## 2017-09-12 NOTE — Telephone Encounter (Signed)
Pt is returning call.  WE#[phone]/PP

## 2017-09-12 NOTE — Telephone Encounter (Signed)
-----   Message from Virginia Crews, MD sent at 09/12/2017 11:56 AM EDT ----- Normal kidney function, liver function, electrolytes, blood counts (the red blood cells are still small in diameter, but there is no anemia), iron studies, cholesterol.  A1c is in the prediabetes range.  Recommend a low-carb diet regular exercise. Vitamin D level is low. Would recommend consistently taking daily vitamin D supplement.  Virginia Crews, MD, MPH [facility] 09/12/2017 11:56 AM

## 2017-09-24 ENCOUNTER — Encounter: Payer: Self-pay | Admitting: Physician Assistant

## 2017-09-24 ENCOUNTER — Ambulatory Visit (INDEPENDENT_AMBULATORY_CARE_PROVIDER_SITE_OTHER): Payer: Managed Care, Other (non HMO) | Admitting: Physician Assistant

## 2017-09-24 VITALS — BP 124/86 | HR 68 | Temp 98.3°F | Resp 16 | Wt 199.0 lb

## 2017-09-24 DIAGNOSIS — S39012A Strain of muscle, fascia and tendon of lower back, initial encounter: Secondary | ICD-10-CM

## 2017-09-24 NOTE — Patient Instructions (Signed)
Lumbosacral Strain Lumbosacral strain is an injury that causes pain in the lower back (lumbosacral spine). This injury usually occurs from overstretching the muscles or ligaments along your spine. A strain can affect one or more muscles or cord-like tissues that connect bones to other bones (ligaments). What are the causes? This condition may be caused by:  A hard, direct hit (blow) to the back.  Excessive stretching of the lower back muscles. This may result from: ? A fall. ? Lifting something heavy. ? Repetitive movements such as bending or crouching.  What increases the risk? The following factors may increase your risk of getting this condition:  Participating in sports or activities that involve: ? A sudden twist of the back. ? Pushing or pulling motions.  Being overweight or obese.  Having poor strength and flexibility, especially tight hamstrings or weak muscles in the back or abdomen.  Having too much of a curve in the lower back.  Having a pelvis that is tilted forward.  What are the signs or symptoms? The main symptom of this condition is pain in the lower back, at the site of the strain. Pain may extend (radiate) down one or both legs. How is this diagnosed? This condition is diagnosed based on:  Your symptoms.  Your medical history.  A physical exam. ? Your health care provider may push on certain areas of your back to determine the source of your pain. ? You may be asked to bend forward, backward, and side to side to assess the severity of your pain and your range of motion.  Imaging tests, such as: ? X-rays. ? MRI.  How is this treated? Treatment for this condition may include:  Putting heat and cold on the affected area.  Medicines to help relieve pain and relax your muscles (muscle relaxants).  NSAIDs to help reduce swelling and discomfort.  When your symptoms improve, it is important to gradually return to your normal routine as soon as possible  to reduce pain, avoid stiffness, and avoid loss of muscle strength. Generally, symptoms should improve within 6 weeks of treatment. However, recovery time varies. Follow these instructions at home: Managing pain, stiffness, and swelling   If directed, put ice on the injured area during the first 24 hours after your strain. ? Put ice in a plastic bag. ? Place a towel between your skin and the bag. ? Leave the ice on for 20 minutes, 2-3 times a day.  If directed, put heat on the affected area as often as told by your health care provider. Use the heat source that your health care provider recommends, such as a moist heat pack or a heating pad. ? Place a towel between your skin and the heat source. ? Leave the heat on for 20-30 minutes. ? Remove the heat if your skin turns bright red. This is especially important if you are unable to feel pain, heat, or cold. You may have a greater risk of getting burned. Activity  Rest and return to your normal activities as told by your health care provider. Ask your health care provider what activities are safe for you.  Avoid activities that take a lot of energy for as long as told by your health care provider. General instructions  Take over-the-counter and prescription medicines only as told by your health care provider.  Donot drive or use heavy machinery while taking prescription pain medicine.  Do not use any products that contain nicotine or tobacco, such as cigarettes and e-cigarettes.   If you need help quitting, ask your health care provider.  Keep all follow-up visits as told by your health care provider. This is important. How is this prevented?  Use correct form when playing sports and lifting heavy objects.  Use good posture when sitting and standing.  Maintain a healthy weight.  Sleep on a mattress with medium firmness to support your back.  Be safe and responsible while being active to avoid falls.  Do at least 150 minutes of  moderate-intensity exercise each week, such as brisk walking or water aerobics. Try a form of exercise that takes stress off your back, such as swimming or stationary cycling.  Maintain physical fitness, including: ? Strength. ? Flexibility. ? Cardiovascular fitness. ? Endurance. Contact a health care provider if:  Your back pain does not improve after 6 weeks of treatment.  Your symptoms get worse. Get help right away if:  Your back pain is severe.  You cannot stand or walk.  You have difficulty controlling when you urinate or when you have a bowel movement.  You feel nauseous or you vomit.  Your feet get very cold.  You have numbness, tingling, weakness, or problems using your arms or legs.  You develop any of the following: ? Shortness of breath. ? Dizziness. ? Pain in your legs. ? Weakness in your buttocks or legs. ? Discoloration of the skin on your toes or legs. This information is not intended to replace advice given to you by your health care provider. Make sure you discuss any questions you have with your health care provider. Document Released: 09/18/2005 Document Revised: 06/28/2016 Document Reviewed: 05/12/2016 Elsevier Interactive Patient Education  2017 [employer].

## 2017-09-24 NOTE — Progress Notes (Signed)
Patient: Martha Mendoza Female    DOB: [birth year]   41 y.o.   MRN: [id number] Visit Date: 09/24/2017  Today's Provider: Trinna Post, PA-C   Chief Complaint  Patient presents with  . Back Pain    Left sided back pain started Sunday   Subjective:      Martha Mendoza is a 41 y/o woman presenting with back pain x 3 days, she is concerned it is a kidney infection. She reports she was moving a laundry cart on Sunday and noticed the pain on her left side. She denies radiation of pain, numbness, tingling in her legs, incontinence. She denies fever, chills, dysuria, frequency. She reports the pain was 9/10 yesterday night but is 2/10 today. It is worse with movement and improved with the ASA she took.   Back Pain  This is a new problem. The current episode started in the past 7 days. The problem occurs constantly. The problem has been waxing and waning since onset. The pain is present in the lumbar spine. The pain does not radiate. The pain is the same all the time. The symptoms are aggravated by twisting. Pertinent negatives include no abdominal pain, bladder incontinence, bowel incontinence, chest pain, dysuria, fever, headaches, leg pain, numbness, paresis, paresthesias, pelvic pain, perianal numbness, tingling, weakness or weight loss. She has tried NSAIDs for the symptoms. The treatment provided moderate relief.       Allergies  Allergen Reactions  . Lisinopril Swelling    angioedema     Current Outpatient Prescriptions:  .  Cholecalciferol (VITAMIN D-3) 5000 units TABS, Take by mouth., Disp: , Rfl:  .  ferrous sulfate 325 (65 FE) MG EC tablet, Take 325 mg by mouth daily with breakfast. , Disp: , Rfl:  .  hydrochlorothiazide (HYDRODIURIL) 25 MG tablet, Take 1 tablet (25 mg total) by mouth daily., Disp: 90 tablet, Rfl: 3 .  Multiple Vitamin (MULTIVITAMIN) tablet, Take 1 tablet by mouth daily., Disp: , Rfl:  .  valACYclovir (VALTREX) 1000 MG tablet, Take 1,000 mg by mouth 2 (two)  times daily as needed., Disp: , Rfl:   Review of Systems  Constitutional: Negative.  Negative for fever and weight loss.  Cardiovascular: Negative for chest pain.  Gastrointestinal: Negative.  Negative for abdominal pain and bowel incontinence.  Genitourinary: Negative for bladder incontinence, decreased urine volume, difficulty urinating, dyspareunia, dysuria, frequency, hematuria, pelvic pain, vaginal bleeding, vaginal discharge and vaginal pain.  Musculoskeletal: Positive for arthralgias (This is a chronic issue) and back pain. Negative for gait problem, joint swelling, myalgias, neck pain and neck stiffness.  Neurological: Negative for dizziness, tingling, weakness, light-headedness, numbness, headaches and paresthesias.    Social History  Substance Use Topics  . Smoking status: Never Smoker  . Smokeless tobacco: Never Used  . Alcohol use No   Objective:   BP 124/86 (BP Location: Left Arm, Patient Position: Sitting, Cuff Size: Large)   Pulse 68   Temp 98.3 F (36.8 C) (Oral)   Resp 16   Wt 199 lb (90.3 kg)   LMP 09/12/2017   BMI 36.99 kg/m  Vitals:   09/24/17 0836  BP: 124/86  Pulse: 68  Resp: 16  Temp: 98.3 F (36.8 C)  TempSrc: Oral  Weight: 199 lb (90.3 kg)     Physical Exam  Constitutional: She is oriented to person, place, and time.  Abdominal: Soft. Bowel sounds are normal. There is no tenderness. There is no CVA tenderness.  Musculoskeletal: Normal range of  motion. She exhibits tenderness. She exhibits no edema or deformity.       Arms: Full Flexion and extension of spine. Pain with left sided bending and twisting.   Neurological: She is alert and oriented to person, place, and time.  Skin: Skin is warm and dry.  Psychiatric: She has a normal mood and affect. Her behavior is normal.        Assessment & Plan:     1. Lumbosacral strain, initial encounter  Appears to be MSK in nature, low suspicion for infectious process. Gentle stretching, pain relief  with Tylenol/NSAIDs, warm heat. Can last a couple of weeks. Call if severely worsening.  Return if symptoms worsen or fail to improve.  The entirety of the information documented in the History of Present Illness, Review of Systems and Physical Exam were personally obtained by me. Portions of this information were initially documented by Ashley Royalty, CMA and reviewed by me for thoroughness and accuracy.          Trinna Post, PA-C  [facility]

## 2017-10-09 ENCOUNTER — Telehealth: Payer: Self-pay | Admitting: Family Medicine

## 2017-10-09 NOTE — Telephone Encounter (Signed)
ROI ([facility]) faxed to [facility] for records.

## 2018-02-09 ENCOUNTER — Ambulatory Visit: Payer: Managed Care, Other (non HMO) | Admitting: Family Medicine

## 2018-02-09 ENCOUNTER — Encounter: Payer: Self-pay | Admitting: Family Medicine

## 2018-02-09 VITALS — BP 110/76 | HR 80 | Temp 98.5°F | Resp 16 | Wt 204.0 lb

## 2018-02-09 DIAGNOSIS — G44209 Tension-type headache, unspecified, not intractable: Secondary | ICD-10-CM

## 2018-02-09 DIAGNOSIS — I10 Essential (primary) hypertension: Secondary | ICD-10-CM | POA: Diagnosis not present

## 2018-02-09 DIAGNOSIS — R0789 Other chest pain: Secondary | ICD-10-CM | POA: Diagnosis not present

## 2018-02-09 NOTE — Patient Instructions (Signed)
Tension Headache A tension headache is a feeling of pain, pressure, or aching that is often felt over the front and sides of the head. The pain can be dull, or it can feel tight (constricting). Tension headaches are not normally associated with nausea or vomiting, and they do not get worse with physical activity. Tension headaches can last from 30 minutes to several days. This is the most common type of headache. CAUSES The exact cause of this condition is not known. Tension headaches often begin after stress, anxiety, or depression. Other triggers may include:  Alcohol.  Too much caffeine, or caffeine withdrawal.  Respiratory infections, such as colds, flu, or sinus infections.  Dental problems or teeth clenching.  Fatigue.  Holding your head and neck in the same position for a long period of time, such as while using a computer.  Smoking. SYMPTOMS Symptoms of this condition include:  A feeling of pressure around the head.  Dull, aching head pain.  Pain felt over the front and sides of the head.  Tenderness in the muscles of the head, neck, and shoulders. DIAGNOSIS This condition may be diagnosed based on your symptoms and a physical exam. Tests may be done, such as a CT scan or an MRI of your head. These tests may be done if your symptoms are severe or unusual. TREATMENT This condition may be treated with lifestyle changes and medicines to help relieve symptoms. HOME CARE INSTRUCTIONS Managing Pain  Take over-the-counter and prescription medicines only as told by your health care provider.  Lie down in a dark, quiet room when you have a headache.  If directed, apply ice to the head and neck area: ? Put ice in a plastic bag. ? Place a towel between your skin and the bag. ? Leave the ice on for 20 minutes, 2-3 times per day.  Use a heating pad or a hot shower to apply heat to the head and neck area as told by your health care provider. Eating and Drinking  Eat meals on  a regular schedule.  Limit alcohol use.  Decrease your caffeine intake, or stop using caffeine. General Instructions  Keep all follow-up visits as told by your health care provider. This is important.  Keep a headache journal to help find out what may trigger your headaches. For example, write down: ? What you eat and drink. ? How much sleep you get. ? Any change to your diet or medicines.  Try massage or other relaxation techniques.  Limit stress.  Sit up straight, and avoid tensing your muscles.  Do not use tobacco products, including cigarettes, chewing tobacco, or e-cigarettes. If you need help quitting, ask your health care provider.  Exercise regularly as told by your health care provider.  Get 7-9 hours of sleep, or the amount recommended by your health care provider. SEEK MEDICAL CARE IF:  Your symptoms are not helped by medicine.  You have a headache that is different from what you normally experience.  You have nausea or you vomit.  You have a fever. SEEK IMMEDIATE MEDICAL CARE IF:  Your headache becomes severe.  You have repeated vomiting.  You have a stiff neck.  You have a loss of vision.  You have problems with speech.  You have pain in your eye or ear.  You have muscular weakness or loss of muscle control.  You lose your balance or you have trouble walking.  You feel faint or you pass out.  You have confusion. This information   is not intended to replace advice given to you by your health care provider. Make sure you discuss any questions you have with your health care provider. Document Released: 12/09/2005 Document Revised: 08/30/2015 Document Reviewed: 04/03/2015 Elsevier Interactive Patient Education  2017 [employer] Nonspecific Chest Pain Chest pain can be caused by many different conditions. There is always a chance that your pain could be related to something serious, such as a heart attack or a blood clot in your lungs. Chest pain  can also be caused by conditions that are not life-threatening. If you have chest pain, it is very important to follow up with your health care provider. What are the causes? Causes of this condition include:  Heartburn.  Pneumonia or bronchitis.  Anxiety or stress.  Inflammation around your heart (pericarditis) or lung (pleuritis or pleurisy).  A blood clot in your lung.  A collapsed lung (pneumothorax). This can develop suddenly on its own (spontaneous pneumothorax) or from trauma to the chest.  Shingles infection (varicella-zoster virus).  Heart attack.  Damage to the bones, muscles, and cartilage that make up your chest wall. This can include: ? Bruised bones due to injury. ? Strained muscles or cartilage due to frequent or repeated coughing or overwork. ? Fracture to one or more ribs. ? Sore cartilage due to inflammation (costochondritis).  What increases the risk? Risk factors for this condition may include:  Activities that increase your risk for trauma or injury to your chest.  Respiratory infections or conditions that cause frequent coughing.  Medical conditions or overeating that can cause heartburn.  Heart disease or family history of heart disease.  Conditions or health behaviors that increase your risk of developing a blood clot.  Having had chicken pox (varicella zoster).  What are the signs or symptoms? Chest pain can feel like:  Burning or tingling on the surface of your chest or deep in your chest.  Crushing, pressure, aching, or squeezing pain.  Dull or sharp pain that is worse when you move, cough, or take a deep breath.  Pain that is also felt in your back, neck, shoulder, or arm, or pain that spreads to any of these areas.  Your chest pain may come and go, or it may stay constant. How is this diagnosed? Lab tests or other studies may be needed to find the cause of your pain. Your health care provider may have you take a test called an ECG  (electrocardiogram). An ECG records your heartbeat patterns at the time the test is performed. You may also have other tests, such as:  Transthoracic echocardiogram (TTE). In this test, sound waves are used to create a picture of the heart structures and to look at how blood flows through your heart.  Transesophageal echocardiogram (TEE).This is a more advanced imaging test that takes images from inside your body. It allows your health care provider to see your heart in finer detail.  Cardiac monitoring. This allows your health care provider to monitor your heart rate and rhythm in real time.  Holter monitor. This is a portable device that records your heartbeat and can help to diagnose abnormal heartbeats. It allows your health care provider to track your heart activity for several days, if needed.  Stress tests. These can be done through exercise or by taking medicine that makes your heart beat more quickly.  Blood tests.  Other imaging tests.  How is this treated? Treatment depends on what is causing your chest pain. Treatment may include:  Medicines.  These may include: ? Acid blockers for heartburn. ? Anti-inflammatory medicine. ? Pain medicine for inflammatory conditions. ? Antibiotic medicine, if an infection is present. ? Medicines to dissolve blood clots. ? Medicines to treat coronary artery disease (CAD).  Supportive care for conditions that do not require medicines. This may include: ? Resting. ? Applying heat or cold packs to injured areas. ? Limiting activities until pain decreases.  Follow these instructions at home: Medicines  If you were prescribed an antibiotic, take it as told by your health care provider. Do not stop taking the antibiotic even if you start to feel better.  Take over-the-counter and prescription medicines only as told by your health care provider. Lifestyle  Do not use any products that contain nicotine or tobacco, such as cigarettes and  e-cigarettes. If you need help quitting, ask your health care provider.  Do not drink alcohol.  Make lifestyle changes as directed by your health care provider. These may include: ? Getting regular exercise. Ask your health care provider to suggest some activities that are safe for you. ? Eating a heart-healthy diet. A registered dietitian can help you to learn healthy eating options. ? Maintaining a healthy weight. ? Managing diabetes, if necessary. ? Reducing stress, such as with yoga or relaxation techniques. General instructions  Avoid any activities that bring on chest pain.  If heartburn is the cause for your chest pain, raise (elevate) the head of your bed about 6 inches (15 cm) by putting blocks under the legs. Sleeping with more pillows does not effectively relieve heartburn because it only changes the position of your head.  Keep all follow-up visits as told by your health care provider. This is important. This includes any further testing if your chest pain does not go away. Contact a health care provider if:  Your chest pain does not go away.  You have a rash with blisters on your chest.  You have a fever.  You have chills. Get help right away if:  Your chest pain is worse.  You have a cough that gets worse, or you cough up blood.  You have severe pain in your abdomen.  You have severe weakness.  You faint.  You have sudden, unexplained chest discomfort.  You have sudden, unexplained discomfort in your arms, back, neck, or jaw.  You have shortness of breath at any time.  You suddenly start to sweat, or your skin gets clammy.  You feel nauseous or you vomit.  You suddenly feel light-headed or dizzy.  Your heart begins to beat quickly, or it feels like it is skipping beats. These symptoms may represent a serious problem that is an emergency. Do not wait to see if the symptoms will go away. Get medical help right away. Call your local emergency services  (911 in the [place] Do not drive yourself to the hospital. This information is not intended to replace advice given to you by your health care provider. Make sure you discuss any questions you have with your health care provider. Document Released: 09/18/2005 Document Revised: 09/02/2016 Document Reviewed: 09/02/2016 Elsevier Interactive Patient Education  2017 [employer].

## 2018-02-09 NOTE — Progress Notes (Signed)
Patient: Martha Mendoza Female    DOB: November 28, 1976   42 y.o.   MRN: [id number] Visit Date: 02/10/2018  Today's Provider: Lavon Paganini, MD   I, Martha Clan, CMA, am acting as scribe for Lavon Paganini, MD.  Chief Complaint  Patient presents with  . Chest Pain   Subjective:    Chest Pain   This is a new problem. Episode onset: sx started with left neck pain 2 weeks ago; chest pain started 2 days ago. She also noticed sweaty palms and dizziness about 2 weeks ago. The problem occurs intermittently. Pain location: beneath left breast. The quality of the pain is described as squeezing. Associated symptoms include diaphoresis, dizziness, headaches, malaise/fatigue (long-standing problem per pt), nausea and near-syncope. Pertinent negatives include no abdominal pain, back pain, claudication, cough, exertional chest pressure, fever, hemoptysis, irregular heartbeat, leg pain, lower extremity edema, numbness, orthopnea, palpitations, shortness of breath ("the pain took my breath away"), sputum production, syncope, vomiting or weakness. Associated symptoms comments: Pt has also noticed a "whooshing" sound in her left ear, which occurs daily when she lays down.   Patient describes acute onset of squeezing pain this happened 2 days ago.  She was sitting at her computer suddenly got up to go to the bathroom.  She sat back down and the pain quickly went away in less than 1 minute.  She had been concerned all weekend because her blood pressure was "high."  She brings her blood pressure cuff with her today that has a log of her blood pressures on it.  It appears that she was checking her blood pressure upwards of 10 times daily. Highest BP recorded is 130s/100.  All other BPs were 120-130s/80s-90.  She denies any shortness of breath, fever, cough she has had chest pain like this once before.  She was told that it was GERD she believes that she was seen by cardiologist for this about 5 years ago and  had a normal stress test.  She reports L sided neck pain that is intermittent x months.  She reports cramping in her neck for her entire life.  This is slightly different than that and feels related to her headaches as though the pain goes from neck into head.    Allergies  Allergen Reactions  . Lisinopril Swelling    angioedema     Current Outpatient Medications:  .  hydrochlorothiazide (HYDRODIURIL) 25 MG tablet, Take 1 tablet (25 mg total) by mouth daily., Disp: 90 tablet, Rfl: 3 .  Multiple Vitamin (MULTIVITAMIN) tablet, Take 1 tablet by mouth daily., Disp: , Rfl:  .  valACYclovir (VALTREX) 1000 MG tablet, Take 1,000 mg by mouth 2 (two) times daily as needed., Disp: , Rfl:  .  Cholecalciferol (VITAMIN D-3) 5000 units TABS, Take by mouth., Disp: , Rfl:  .  ferrous sulfate 325 (65 FE) MG EC tablet, Take 325 mg by mouth daily with breakfast. , Disp: , Rfl:   Review of Systems  Constitutional: Positive for diaphoresis and malaise/fatigue (long-standing problem per pt). Negative for activity change, appetite change, chills and fever.  HENT: Negative.   Eyes: Negative.   Respiratory: Negative for apnea, cough, hemoptysis, sputum production, choking, chest tightness, shortness of breath ("the pain took my breath away") and wheezing.   Cardiovascular: Positive for chest pain and near-syncope. Negative for palpitations, orthopnea, claudication, leg swelling and syncope.  Gastrointestinal: Positive for nausea. Negative for abdominal distention, abdominal pain, blood in stool and vomiting.  Genitourinary: Negative.  Musculoskeletal: Negative for back pain.  Neurological: Positive for dizziness and headaches. Negative for weakness and numbness.    Social History   Tobacco Use  . Smoking status: Never Smoker  . Smokeless tobacco: Never Used  Substance Use Topics  . Alcohol use: No   Objective:   BP 110/76 (BP Location: Left Arm, Patient Position: Sitting, Cuff Size: Large)   Pulse  80   Temp 98.5 F (36.9 C) (Oral)   Resp 16   Wt 204 lb (92.5 kg)   LMP 01/29/2018   SpO2 95%   BMI 37.92 kg/m  Vitals:   02/09/18 1544  BP: 110/76  Pulse: 80  Resp: 16  Temp: 98.5 F (36.9 C)  TempSrc: Oral  SpO2: 95%  Weight: 204 lb (92.5 kg)     Physical Exam  Constitutional: She is oriented to person, place, and time. She appears well-developed and well-nourished. No distress.  HENT:  Head: Normocephalic and atraumatic.  Eyes: Conjunctivae are normal. Pupils are equal, round, and reactive to light. No scleral icterus.  Neck: Normal range of motion. Neck supple. No JVD present. No thyromegaly present.  Cardiovascular: Normal rate, regular rhythm, normal heart sounds and intact distal pulses.  No murmur heard. Pulmonary/Chest: Effort normal and breath sounds normal. No respiratory distress. She has no wheezes. She has no rales.  Abdominal: She exhibits no distension. There is no tenderness.  Musculoskeletal: She exhibits no edema.  Lymphadenopathy:    She has no cervical adenopathy.  Neurological: She is alert and oriented to person, place, and time.  Skin: Skin is warm and dry. No rash noted.  Psychiatric: Her speech is normal and behavior is normal. Judgment and thought content normal. Her mood appears anxious. Cognition and memory are normal.  Vitals reviewed.  EKG: NSR, no ST elevation     Assessment & Plan:     Problem List Items Addressed This Visit      Cardiovascular and Mediastinum   Hypertension    Well controlled Reassured patient that her home readings are wnl and only high reading was after checking several times in a row Continue HCTZ Recent CMP reviewed F/u in 6 months        Other   Tension headache    Discussed that neck pain likely related to tension headaches Try NSAIDs/tylenol, ice/heat, massage, relaxation Return precautions discussed      Other chest pain - Primary    No evidence of cardiac etiology EKG normal Likely anxiety  related given that she has been checking BP compulsively and she was worrying when this occurred Return precautions discussed      Relevant Orders   EKG 12-Lead (Completed)      Return if symptoms worsen or fail to improve.   The entirety of the information documented in the History of Present Illness, Review of Systems and Physical Exam were personally obtained by me. Portions of this information were initially documented by Raquel Sarna Ratchford, CMA and reviewed by me for thoroughness and accuracy.    Virginia Crews, MD, MPH [facility] 02/10/2018 9:20 AM

## 2018-02-10 DIAGNOSIS — G44209 Tension-type headache, unspecified, not intractable: Secondary | ICD-10-CM | POA: Insufficient documentation

## 2018-02-10 DIAGNOSIS — R0789 Other chest pain: Secondary | ICD-10-CM | POA: Insufficient documentation

## 2018-02-10 NOTE — Assessment & Plan Note (Signed)
No evidence of cardiac etiology EKG normal Likely anxiety related given that she has been checking BP compulsively and she was worrying when this occurred Return precautions discussed

## 2018-02-10 NOTE — Assessment & Plan Note (Signed)
Discussed that neck pain likely related to tension headaches Try NSAIDs/tylenol, ice/heat, massage, relaxation Return precautions discussed

## 2018-02-10 NOTE — Assessment & Plan Note (Addendum)
Well controlled Reassured patient that her home readings are wnl and only high reading was after checking several times in a row Continue HCTZ Recent CMP reviewed F/u in 6 months

## 2018-03-11 ENCOUNTER — Encounter: Payer: Self-pay | Admitting: Family Medicine

## 2018-03-11 ENCOUNTER — Ambulatory Visit (INDEPENDENT_AMBULATORY_CARE_PROVIDER_SITE_OTHER): Payer: Managed Care, Other (non HMO) | Admitting: Family Medicine

## 2018-03-11 VITALS — BP 162/110 | HR 80 | Temp 99.4°F | Resp 16 | Ht 62.0 in | Wt 202.0 lb

## 2018-03-11 DIAGNOSIS — H9313 Tinnitus, bilateral: Secondary | ICD-10-CM

## 2018-03-11 DIAGNOSIS — Z0000 Encounter for general adult medical examination without abnormal findings: Secondary | ICD-10-CM

## 2018-03-11 DIAGNOSIS — F419 Anxiety disorder, unspecified: Secondary | ICD-10-CM | POA: Diagnosis not present

## 2018-03-11 DIAGNOSIS — I10 Essential (primary) hypertension: Secondary | ICD-10-CM

## 2018-03-11 DIAGNOSIS — E669 Obesity, unspecified: Secondary | ICD-10-CM | POA: Diagnosis not present

## 2018-03-11 DIAGNOSIS — H9319 Tinnitus, unspecified ear: Secondary | ICD-10-CM | POA: Insufficient documentation

## 2018-03-11 DIAGNOSIS — R7303 Prediabetes: Secondary | ICD-10-CM

## 2018-03-11 DIAGNOSIS — Z6836 Body mass index (BMI) 36.0-36.9, adult: Secondary | ICD-10-CM | POA: Diagnosis not present

## 2018-03-11 DIAGNOSIS — E66812 Obesity, class 2: Secondary | ICD-10-CM

## 2018-03-11 DIAGNOSIS — Z0001 Encounter for general adult medical examination with abnormal findings: Secondary | ICD-10-CM | POA: Diagnosis not present

## 2018-03-11 MED ORDER — HYDROCHLOROTHIAZIDE 12.5 MG PO TABS: 12.5000 mg | ORAL_TABLET | Freq: Every day | ORAL | 3 refills | Status: DC

## 2018-03-11 NOTE — Assessment & Plan Note (Signed)
Uncontrolled Side effects from higher dose of HCTZ We will decrease back to 12.5 mg daily Patient reports home blood pressures other than yesterday and today have been well controlled Stress and anxiety may contribute greatly to her elevated blood pressures Check BMP Follow-up in 3 months

## 2018-03-11 NOTE — Assessment & Plan Note (Signed)
Discussed low carb diet and exercise Recheck A1c 

## 2018-03-11 NOTE — Progress Notes (Signed)
Patient: Martha Mendoza, Female    DOB: 1976/01/24, 42 y.o.   MRN: [id number] Visit Date: 03/11/2018  Today's Provider: Lavon Paganini, MD   I, Martha Clan, CMA, am acting as scribe for Lavon Paganini, MD.  Chief Complaint  Patient presents with  . Annual Exam   Subjective:    Annual physical exam Martha Mendoza is a 42 y.o. female who presents today for health maintenance and complete physical. She feels fairly well. She states her HCTZ causes her to feel poorly. She states she experiencing headaches, arthralgias, dry eyes, a "whooshing" sound in her ears. She stopped taking this medication about 2 weeks ago, and felt better. She felt better on the lower dose of HCTZ.  She states she felt her BP elevate last night, so she did take 1 tab. She reports exercising none. She reports she is sleeping poorly. She states she believes this is caused by stress.  States that she stays stressed, but she is trying to work on it.  Her stress comes from work, daughter in [employer], preparing to take the [employer] next month, ex husband problems.  Feels overwhelmed currently.  Thinks she has been anxious her entire life.  Previously treated.  Tried Zoloft (felt like it took too long to take effect), Paxil (weight gain), Adderall (caused uncontrollable crying).  Tinnitus: Present for the last few months.  She states it has gotten somewhat better since stopping her HCTZ.  She hears it mostly when she is lying down at night to go to sleep.  She describes it as a whooshing sound in her ears bilaterally.  She denies any sinus congestion.  She has not had no other medication changes recently.  Last pap- 10/11/2015 NIL; HPV negative Last mammo- 09/22/2017- BI-RADS 1 -----------------------------------------------------------------   Review of Systems  Constitutional: Negative.   HENT: Negative.   Eyes: Negative.   Respiratory: Negative.   Cardiovascular: Negative.   Gastrointestinal:  Negative.   Endocrine: Negative.   Genitourinary: Negative.   Musculoskeletal: Negative.   Skin: Negative.   Allergic/Immunologic: Negative.   Neurological: Negative.   Hematological: Negative.   Psychiatric/Behavioral: Negative.     Social History      She  reports that  has never smoked. she has never used smokeless tobacco. She reports that she does not drink alcohol or use drugs.       Social History   Socioeconomic History  . Marital status: Divorced    Spouse name: Not on file  . Number of children: 2  . Years of education: 5  . Highest education level: Not on file  Social Needs  . Financial resource strain: Not on file  . Food insecurity - worry: Not on file  . Food insecurity - inability: Not on file  . Transportation needs - medical: Not on file  . Transportation needs - non-medical: Not on file  Occupational History  . 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 Smoking status: Never Smoker  . Smokeless tobacco: Never Used  Substance and Sexual Activity  . Alcohol use: No  . Drug use: No  . Sexual activity: Yes    Partners: Male    Birth control/protection: None  Other Topics Concern  . Not on file  Social History Narrative  . Not on file    Past Medical History:  Diagnosis Date  . Fever blister   . Hypertension   .  Iron deficiency anemia   . Vitamin D deficiency      Patient Active Problem List   Diagnosis Date Noted  . Tension headache 02/10/2018  . Other chest pain 02/10/2018  . Family history of breast cancer 09/11/2017  . Class 2 obesity without serious comorbidity with body mass index (BMI) of 36.0 to 36.9 in adult 09/11/2017  . Leg cramping 09/11/2017  . Menorrhagia 09/11/2017  . SI (sacroiliac) joint dysfunction 09/11/2017  . Vitamin D deficiency   . Iron deficiency anemia   . Hypertension     Past Surgical History:  Procedure Laterality Date  . BREAST BIOPSY Right 11/28/2015    FIBROADENOMA WITH USUAL DUCTAL HYPERPLASIA  . BREAST EXCISIONAL BIOPSY Left 1999   benign  . ROOT CANAL    . WISDOM TOOTH EXTRACTION  01/2017   x4    Family History        Family Status  Relation Name Status  . Mother  Deceased  . Mat Aunt  (Not Specified)  . Father  Alive  . Sister  Alive  . Brother  Alive  . Brother  Alive  . Mat Uncle  (Not Specified)        Her family history includes Breast cancer in her maternal aunt; Breast cancer (age of onset: 19) in her mother; Diabetes in her mother; Healthy in her brother; Hypertension in her brother, mother, and sister; Prostate cancer in her maternal uncle; Stroke (age of onset: 75) in her mother.      Allergies  Allergen Reactions  . Lisinopril Swelling    angioedema     Current Outpatient Medications:  .  Cholecalciferol (VITAMIN D-3) 5000 units TABS, Take by mouth., Disp: , Rfl:  .  ferrous sulfate 325 (65 FE) MG EC tablet, Take 325 mg by mouth daily with breakfast. , Disp: , Rfl:  .  hydrochlorothiazide (HYDRODIURIL) 25 MG tablet, Take 1 tablet (25 mg total) by mouth daily. (Patient not taking: Reported on 03/11/2018), Disp: 90 tablet, Rfl: 3 .  Multiple Vitamin (MULTIVITAMIN) tablet, Take 1 tablet by mouth daily., Disp: , Rfl:  .  valACYclovir (VALTREX) 1000 MG tablet, Take 1,000 mg by mouth 2 (two) times daily as needed., Disp: , Rfl:    Patient Care Team: Virginia Crews, MD as PCP - General (Family Medicine)      Objective:   Vitals: BP (!) 162/110 (BP Location: Left Arm, Patient Position: Sitting, Cuff Size: Large)   Pulse 80   Temp 99.4 F (37.4 C) (Oral)   Resp 16   Ht 5\' 2"  (1.575 m)   Wt 202 lb (91.6 kg)   LMP 02/20/2018   SpO2 97%   BMI 36.95 kg/m    Vitals:   03/11/18 1411  BP: (!) 162/110  Pulse: 80  Resp: 16  Temp: 99.4 F (37.4 C)  TempSrc: Oral  SpO2: 97%  Weight: 202 lb (91.6 kg)  Height: 5\' 2"  (1.575 m)     Physical Exam  Constitutional: She is oriented to person, place, and  time. She appears well-developed and well-nourished. No distress.  HENT:  Head: Normocephalic and atraumatic.  Right Ear: External ear normal.  Left Ear: External ear normal.  Nose: Nose normal.  Mouth/Throat: Oropharynx is clear and moist.  Eyes: Conjunctivae and EOM are normal. Pupils are equal, round, and reactive to light. No scleral icterus.  Neck: Neck supple. No thyromegaly present.  Cardiovascular: Normal rate, regular rhythm, normal heart sounds and intact distal pulses.  No  murmur heard. Pulmonary/Chest: Effort normal and breath sounds normal. No respiratory distress. She has no wheezes. She has no rales.  Abdominal: Soft. Bowel sounds are normal. She exhibits no distension. There is no tenderness. There is no rebound and no guarding.  Musculoskeletal: She exhibits no edema or deformity.  Lymphadenopathy:    She has no cervical adenopathy.  Neurological: She is alert and oriented to person, place, and time.  Skin: Skin is warm and dry. No rash noted.  Psychiatric: She has a normal mood and affect. Her behavior is normal.  Vitals reviewed.    Depression Screen PHQ 2/9 Scores 09/11/2017  PHQ - 2 Score 1    Assessment & Plan:     Routine Health Maintenance and Physical Exam  Exercise Activities and Dietary recommendations Goals    None      Immunization History  Administered Date(s) Administered  . Tdap 06/24/2016    Health Maintenance  Topic Date Due  . HIV Screening  05/25/1990  . INFLUENZA VACCINE  03/22/2018 (Originally 07/23/2017)  . PAP SMEAR  10/10/2018  . TETANUS/TDAP  06/24/2026     Discussed health benefits of physical activity, and encouraged her to engage in regular exercise appropriate for her age and condition.    -------------------------------------------------------------------- Problem List Items Addressed This Visit      Cardiovascular and Mediastinum   Hypertension    Uncontrolled Side effects from higher dose of HCTZ We will  decrease back to 12.5 mg daily Patient reports home blood pressures other than yesterday and today have been well controlled Stress and anxiety may contribute greatly to her elevated blood pressures Check BMP Follow-up in 3 months      Relevant Medications   hydrochlorothiazide (HYDRODIURIL) 12.5 MG tablet   Other Relevant Orders   Basic Metabolic Panel (BMET)     Other   Class 2 obesity without serious comorbidity with body mass index (BMI) of 36.0 to 36.9 in adult    Discussed diet and exercise      Pre-diabetes    Discussed low-carb diet and exercise Recheck A1c      Relevant Orders   Hemoglobin A1c   Tinnitus    Unclear etiology May have been related to her HCTZ, though this is not usually ototoxic We can consider switching from HCTZ to amlodipine in the future if this continues We will treat for possible eustachian tube dysfunction with Flonase daily No signs of vascular disease Could consider ENT referral in the future      Anxiety    Uncontrolled Patient is not interested in treatment at this time, though we did discuss possible benefits of SSRIs Patient would also benefit from therapy and we discussed the importance of this in the management of anxiety We will continue to monitor and see if this continues after her very stressful life events have resolved       Other Visit Diagnoses    Encounter for annual physical exam    -  Primary       Return in about 3 months (around 06/11/2018) for BP f/u.   The entirety of the information documented in the History of Present Illness, Review of Systems and Physical Exam were personally obtained by me. Portions of this information were initially documented by Raquel Sarna Ratchford, CMA and reviewed by me for thoroughness and accuracy.    Virginia Crews, MD, MPH [facility] 03/11/2018 3:18 PM

## 2018-03-11 NOTE — Patient Instructions (Signed)
Preventive Care 40-64 Years, Female Preventive care refers to lifestyle choices and visits with your health care provider that can promote health and wellness. What does preventive care include?  A yearly physical exam. This is also called an annual well check.  Dental exams once or twice a year.  Routine eye exams. Ask your health care provider how often you should have your eyes checked.  Personal lifestyle choices, including: ? Daily care of your teeth and gums. ? Regular physical activity. ? Eating a healthy diet. ? Avoiding tobacco and drug use. ? Limiting alcohol use. ? Practicing safe sex. ? Taking low-dose aspirin daily starting at age 58. ? Taking vitamin and mineral supplements as recommended by your health care provider. What happens during an annual well check? The services and screenings done by your health care provider during your annual well check will depend on your age, overall health, lifestyle risk factors, and family history of disease. Counseling Your health care provider may ask you questions about your:  Alcohol use.  Tobacco use.  Drug use.  Emotional well-being.  Home and relationship well-being.  Sexual activity.  Eating habits.  Work and work Statistician.  Method of birth control.  Menstrual cycle.  Pregnancy history.  Screening You may have the following tests or measurements:  Height, weight, and BMI.  Blood pressure.  Lipid and cholesterol levels. These may be checked every 5 years, or more frequently if you are over 81 years old.  Skin check.  Lung cancer screening. You may have this screening every year starting at age 78 if you have a 30-pack-year history of smoking and currently smoke or have quit within the past 15 years.  Fecal occult blood test (FOBT) of the stool. You may have this test every year starting at age 65.  Flexible sigmoidoscopy or colonoscopy. You may have a sigmoidoscopy every 5 years or a colonoscopy  every 10 years starting at age 30.  Hepatitis C blood test.  Hepatitis B blood test.  Sexually transmitted disease (STD) testing.  Diabetes screening. This is done by checking your blood sugar (glucose) after you have not eaten for a while (fasting). You may have this done every 1-3 years.  Mammogram. This may be done every 1-2 years. Talk to your health care provider about when you should start having regular mammograms. This may depend on whether you have a family history of breast cancer.  BRCA-related cancer screening. This may be done if you have a family history of breast, ovarian, tubal, or peritoneal cancers.  Pelvic exam and Pap test. This may be done every 3 years starting at age 80. Starting at age 36, this may be done every 5 years if you have a Pap test in combination with an HPV test.  Bone density scan. This is done to screen for osteoporosis. You may have this scan if you are at high risk for osteoporosis.  Discuss your test results, treatment options, and if necessary, the need for more tests with your health care provider. Vaccines Your health care provider may recommend certain vaccines, such as:  Influenza vaccine. This is recommended every year.  Tetanus, diphtheria, and acellular pertussis (Tdap, Td) vaccine. You may need a Td booster every 10 years.  Varicella vaccine. You may need this if you have not been vaccinated.  Zoster vaccine. You may need this after age 5.  Measles, mumps, and rubella (MMR) vaccine. You may need at least one dose of MMR if you were born in  1957 or later. You may also need a second dose.  Pneumococcal 13-valent conjugate (PCV13) vaccine. You may need this if you have certain conditions and were not previously vaccinated.  Pneumococcal polysaccharide (PPSV23) vaccine. You may need one or two doses if you smoke cigarettes or if you have certain conditions.  Meningococcal vaccine. You may need this if you have certain  conditions.  Hepatitis A vaccine. You may need this if you have certain conditions or if you travel or work in places where you may be exposed to hepatitis A.  Hepatitis B vaccine. You may need this if you have certain conditions or if you travel or work in places where you may be exposed to hepatitis B.  Haemophilus influenzae type b (Hib) vaccine. You may need this if you have certain conditions.  Talk to your health care provider about which screenings and vaccines you need and how often you need them. This information is not intended to replace advice given to you by your health care provider. Make sure you discuss any questions you have with your health care provider. Document Released: 01/05/2016 Document Revised: 08/28/2016 Document Reviewed: 10/10/2015 Elsevier Interactive Patient Education  2018 [employer]vier Inc.  

## 2018-03-11 NOTE — Assessment & Plan Note (Signed)
Discussed diet and exercise 

## 2018-03-11 NOTE — Assessment & Plan Note (Signed)
Uncontrolled Patient is not interested in treatment at this time, though we did discuss possible benefits of SSRIs Patient would also benefit from therapy and we discussed the importance of this in the management of anxiety We will continue to monitor and see if this continues after her very stressful life events have resolved

## 2018-03-11 NOTE — Assessment & Plan Note (Signed)
Unclear etiology May have been related to her HCTZ, though this is not usually ototoxic We can consider switching from HCTZ to amlodipine in the future if this continues We will treat for possible eustachian tube dysfunction with Flonase daily No signs of vascular disease Could consider ENT referral in the future

## 2018-03-12 ENCOUNTER — Telehealth: Payer: Self-pay

## 2018-03-12 LAB — BASIC METABOLIC PANEL
BUN / CREAT RATIO: 9 (ref 9–23)
BUN: 8 mg/dL (ref 6–24)
CALCIUM: 10.4 mg/dL — AB (ref 8.7–10.2)
CO2: 23 mmol/L (ref 20–29)
CREATININE: 0.89 mg/dL (ref 0.57–1.00)
Chloride: 99 mmol/L (ref 96–106)
GFR calc non Af Amer: 80 mL/min/{1.73_m2} (ref >59)
GFR, EST AFRICAN AMERICAN: 92 mL/min/{1.73_m2} (ref >59)
GLUCOSE: 89 mg/dL (ref 65–99)
Potassium: 3.9 mmol/L (ref 3.5–5.2)
Sodium: 138 mmol/L (ref 134–144)

## 2018-03-12 LAB — HEMOGLOBIN A1C
Est. average glucose Bld gHb Est-mCnc: 126 mg/dL
HEMOGLOBIN A1C: 6 % — AB (ref 4.8–5.6)

## 2018-03-12 NOTE — Telephone Encounter (Signed)
Pt advised and agrees with treatment plan. 

## 2018-03-12 NOTE — Telephone Encounter (Signed)
-----   Message from Virginia Crews, MD sent at 03/12/2018  8:27 AM EDT ----- Normal electrolytes and kidney function, except calcium is very slightly high.  Would hold calcium supplements until next visit and we can recheck at that point.  Hemoglobin A1c is stable at 6.  This does represent prediabetes.  Recommend regular exercise and low-carb diet.  Virginia Crews, MD, MPH [facility] 03/12/2018 8:27 AM

## 2018-04-18 ENCOUNTER — Emergency Department: Payer: Managed Care, Other (non HMO)

## 2018-04-18 ENCOUNTER — Other Ambulatory Visit: Payer: Self-pay

## 2018-04-18 ENCOUNTER — Emergency Department
Admission: EM | Admit: 2018-04-18 | Discharge: 2018-04-19 | Disposition: A | Discharge status: Home / Self Care | Payer: Managed Care, Other (non HMO) | Attending: Emergency Medicine | Admitting: Emergency Medicine

## 2018-04-18 DIAGNOSIS — R109 Unspecified abdominal pain: Secondary | ICD-10-CM

## 2018-04-18 DIAGNOSIS — I10 Essential (primary) hypertension: Secondary | ICD-10-CM | POA: Diagnosis not present

## 2018-04-18 DIAGNOSIS — Z79899 Other long term (current) drug therapy: Secondary | ICD-10-CM | POA: Insufficient documentation

## 2018-04-18 DIAGNOSIS — R1032 Left lower quadrant pain: Secondary | ICD-10-CM | POA: Diagnosis present

## 2018-04-18 DIAGNOSIS — D219 Benign neoplasm of connective and other soft tissue, unspecified: Secondary | ICD-10-CM | POA: Insufficient documentation

## 2018-04-18 DIAGNOSIS — N83202 Unspecified ovarian cyst, left side: Secondary | ICD-10-CM | POA: Diagnosis not present

## 2018-04-18 DIAGNOSIS — N938 Other specified abnormal uterine and vaginal bleeding: Secondary | ICD-10-CM | POA: Diagnosis not present

## 2018-04-18 LAB — COMPREHENSIVE METABOLIC PANEL
ALBUMIN: 3.9 g/dL (ref 3.5–5.0)
ALT: 11 U/L — ABNORMAL LOW (ref 14–54)
ANION GAP: 8 (ref 5–15)
AST: 20 U/L (ref 15–41)
Alkaline Phosphatase: 59 U/L (ref 38–126)
BUN: 10 mg/dL (ref 6–20)
CHLORIDE: 102 mmol/L (ref 101–111)
CO2: 26 mmol/L (ref 22–32)
Calcium: 9.2 mg/dL (ref 8.9–10.3)
Creatinine, Ser: 0.89 mg/dL (ref 0.44–1.00)
GFR calc non Af Amer: >60 mL/min (ref >60)
GLUCOSE: 163 mg/dL — AB (ref 65–99)
POTASSIUM: 3.2 mmol/L — AB (ref 3.5–5.1)
SODIUM: 136 mmol/L (ref 135–145)
Total Bilirubin: 0.3 mg/dL (ref 0.3–1.2)
Total Protein: 7.6 g/dL (ref 6.5–8.1)

## 2018-04-18 LAB — CBC
HEMATOCRIT: 37.9 % (ref 35.0–47.0)
HEMOGLOBIN: 12.2 g/dL (ref 12.0–16.0)
MCH: 20.7 pg — AB (ref 26.0–34.0)
MCHC: 32.2 g/dL (ref 32.0–36.0)
MCV: 64.2 fL — AB (ref 80.0–100.0)
Platelets: 292 10*3/uL (ref 150–440)
RBC: 5.91 MIL/uL — AB (ref 3.80–5.20)
RDW: 15.6 % — ABNORMAL HIGH (ref 11.5–14.5)
WBC: 9.4 10*3/uL (ref 3.6–11.0)

## 2018-04-18 LAB — URINALYSIS, COMPLETE (UACMP) WITH MICROSCOPIC
BACTERIA UA: NONE SEEN
Bilirubin Urine: NEGATIVE
Glucose, UA: NEGATIVE mg/dL
KETONES UR: NEGATIVE mg/dL
Leukocytes, UA: NEGATIVE
Nitrite: NEGATIVE
Protein, ur: 100 mg/dL — AB
Specific Gravity, Urine: 1.028 (ref 1.005–1.030)
pH: 5 (ref 5.0–8.0)

## 2018-04-18 LAB — POCT PREGNANCY, URINE: PREG TEST UR: NEGATIVE

## 2018-04-18 NOTE — ED Triage Notes (Signed)
Pt states left lower quadrant pain with vaginal bleeding that began at 1830 today. Pt denies diarrhea, states does have  Chills and nausea. Pt appears in no acute distress. Pt states pain increases with movement.

## 2018-04-18 NOTE — ED Notes (Signed)
Pt to the er for pain to the llq. Pt says her pain increased when she went to the bathroom earlier. Pain began at 1830. Last BM this am and was nml. Pt states she also is having mid cycle bleeding which is unusual. Ain began in the upper left quad and now has moved down.

## 2018-04-19 LAB — WET PREP, GENITAL
Clue Cells Wet Prep HPF POC: NONE SEEN
SPERM: NONE SEEN
Trich, Wet Prep: NONE SEEN
Yeast Wet Prep HPF POC: NONE SEEN

## 2018-04-19 LAB — CHLAMYDIA/NGC RT PCR (ARMC ONLY)
CHLAMYDIA TR: NOT DETECTED
N gonorrhoeae: NOT DETECTED

## 2018-04-19 MED ORDER — TRAMADOL HCL 50 MG PO TABS: 50.0000 mg | ORAL_TABLET | Freq: Four times a day (QID) | ORAL | 0 refills | Status: DC | PRN

## 2018-04-19 MED ORDER — IBUPROFEN 600 MG PO TABS
600.0000 mg | ORAL_TABLET | Freq: Once | ORAL | Status: AC
  Administered 2018-04-19: 600 mg via ORAL
  Filled 2018-04-19: qty 1

## 2018-04-19 NOTE — Discharge Instructions (Signed)
Please follow-up with your physician for further evaluation.  Please return with any worsening symptoms or any other concerns.

## 2018-04-19 NOTE — ED Provider Notes (Signed)
[facility] Emergency Department Provider Note   ____________________________________________   First MD Initiated Contact with Patient 04/18/18 2312     (approximate)  I have reviewed the triage vital signs and the nursing notes.   HISTORY  Chief Complaint Abdominal Pain    HPI Martha Mendoza is a 42 y.o. female who comes into the hospital today with some abdominal pain and vaginal bleeding.  The pain is in her left lower quadrant and the vaginal bleeding started on 630.  The patient's last menstrual period was 415.  She reports that the bleeding is bright red but it is light.  She states that she is not soaking through any pads.  She notices the blood when she wipes at the tissue.  The patient did not take anything for pain and she is never had it before.  She states that her pain is a 3 out of 10 in intensity currently but it is worse when she is moving around.  The patient has had some nausea and vomited earlier when the pain started but not since.  She denies any fever.  She states that it is uncomfortable when she urinates and that is been going on as well.  She does not feel though that she has a UTI she is here for evaluation.  Past Medical History:  Diagnosis Date  . Fever blister   . Hypertension   . Iron deficiency anemia   . Vitamin D deficiency     Patient Active Problem List   Diagnosis Date Noted  . Pre-diabetes 03/11/2018  . Tinnitus 03/11/2018  . Anxiety 03/11/2018  . Tension headache 02/10/2018  . Family history of breast cancer 09/11/2017  . Class 2 obesity without serious comorbidity with body mass index (BMI) of 36.0 to 36.9 in adult 09/11/2017  . Leg cramping 09/11/2017  . Menorrhagia 09/11/2017  . SI (sacroiliac) joint dysfunction 09/11/2017  . Vitamin D deficiency   . Iron deficiency anemia   . Hypertension     Past Surgical History:  Procedure Laterality Date  . BREAST BIOPSY Right 11/28/2015   FIBROADENOMA WITH USUAL  DUCTAL HYPERPLASIA  . BREAST EXCISIONAL BIOPSY Left 1999   benign  . ROOT CANAL    . WISDOM TOOTH EXTRACTION  01/2017   x4    Prior to Admission medications   Medication Sig Start Date End Date Taking? Authorizing Provider  Cholecalciferol (VITAMIN D-3) 5000 units TABS Take by mouth.    [provider]  ferrous sulfate 325 (65 FE) MG EC tablet Take 325 mg by mouth daily with breakfast.     [provider]  hydrochlorothiazide (HYDRODIURIL) 12.5 MG tablet Take 1 tablet (12.5 mg total) by mouth daily. 03/11/18   Virginia Crews, MD  Multiple Vitamin (MULTIVITAMIN) tablet Take 1 tablet by mouth daily.    [provider]  traMADol (ULTRAM) 50 MG tablet Take 1 tablet (50 mg total) by mouth every 6 (six) hours as needed. 04/19/18   Loney Hering, MD  valACYclovir (VALTREX) 1000 MG tablet Take 1,000 mg by mouth 2 (two) times daily as needed.    [provider]    Allergies Lisinopril  Family History  Problem Relation Age of Onset  . Breast cancer Mother 70       metastatic  . Stroke Mother 62  . Hypertension Mother   . Diabetes Mother   . Breast cancer Maternal Aunt        unknown age  . Healthy  Father   . Hypertension Sister   . Cancer Sister   . Hypertension Brother   . Healthy Brother   . Prostate cancer Maternal Uncle   . Colon cancer Neg Hx     Social History Social History   Tobacco Use  . Smoking status: Never Smoker  . Smokeless tobacco: Never Used  Substance Use Topics  . Alcohol use: No  . Drug use: No    Review of Systems  Constitutional: No fever/chills Eyes: No visual changes. ENT: No sore throat. Cardiovascular: Denies chest pain. Respiratory: Denies shortness of breath. Gastrointestinal: abdominal pain.  nausea,  vomiting.  No diarrhea.  No constipation. Genitourinary:  dysuria. Musculoskeletal: Negative for back pain. Skin: Negative for rash. Neurological: Negative for headaches, focal weakness or  numbness.   ____________________________________________   PHYSICAL EXAM:  VITAL SIGNS: ED Triage Vitals [04/18/18 1921]  Enc Vitals Group     BP (!) 162/88     Pulse Rate 84     Resp 16     Temp 99.3 F (37.4 C)     Temp Source Oral     SpO2 100 %     Weight 205 lb (93 kg)     Height 5\' 1"  (1.549 m)     Head Circumference      Peak Flow      Pain Score 7     Pain Loc      Pain Edu?      Excl. in [place]?     Constitutional: Alert and oriented. Well appearing and in moderate distress. Eyes: Conjunctivae are normal. PERRL. EOMI. Head: Atraumatic. Nose: No congestion/rhinnorhea. Mouth/Throat: Mucous membranes are moist.  Oropharynx non-erythematous. Cardiovascular: Normal rate, regular rhythm. Grossly normal heart sounds.  Good peripheral circulation. Respiratory: Normal respiratory effort.  No retractions. Lungs CTAB. Gastrointestinal: Soft and nontender. No distention.  Positive bowel sounds Genitourinary: Normal external genitalia with some cervical motion tenderness and some left-sided adnexal tenderness to palpation. Musculoskeletal: No lower extremity tenderness nor edema.   Neurologic:  Normal speech and language.  Skin:  Skin is warm, dry and intact. Marland Kitchen Psychiatric: Mood and affect are normal.   ____________________________________________   LABS (all labs ordered are listed, but only abnormal results are displayed)  Labs Reviewed  WET PREP, GENITAL - Abnormal; Notable for the following components:      Result Value   WBC, Wet Prep HPF POC FEW (*)    All other components within normal limits  COMPREHENSIVE METABOLIC PANEL - Abnormal; Notable for the following components:   Potassium 3.2 (*)    Glucose, Bld 163 (*)    ALT 11 (*)    All other components within normal limits  CBC - Abnormal; Notable for the following components:   RBC 5.91 (*)    MCV 64.2 (*)    MCH 20.7 (*)    RDW 15.6 (*)    All other components within normal limits  URINALYSIS, COMPLETE  (UACMP) WITH MICROSCOPIC - Abnormal; Notable for the following components:   Color, Urine AMBER (*)    APPearance CLOUDY (*)    Hgb urine dipstick LARGE (*)    Protein, ur 100 (*)    RBC / HPF >50 (*)    All other components within normal limits  CHLAMYDIA/NGC RT PCR ([facility] ONLY)  POCT PREGNANCY, URINE   ____________________________________________  EKG  none ____________________________________________  RADIOLOGY  ED MD interpretation:  US pelvis: 2cm complex left ovarian cyst, favored to reflect a hemorrhagic cyst, Fibroid  uterus, no other acute abnormality  Official radiology report(s): US Pelvis Transvanginal Non-ob (tv Only)  Result Date: 04/19/2018 CLINICAL DATA:  Initial evaluation for acute left lower quadrant pain with vaginal bleeding. EXAM: TRANSABDOMINAL AND TRANSVAGINAL ULTRASOUND OF PELVIS DOPPLER ULTRASOUND OF OVARIES TECHNIQUE: Both transabdominal and transvaginal ultrasound examinations of the pelvis were performed. Transabdominal technique was performed for global imaging of the pelvis including uterus, ovaries, adnexal regions, and pelvic cul-de-sac. It was necessary to proceed with endovaginal exam following the transabdominal exam to visualize the uterus, endometrium, and ovaries. Color and duplex Doppler ultrasound was utilized to evaluate blood flow to the ovaries. COMPARISON:  None. FINDINGS: Uterus Measurements: 9.5 x 5.1 x 5.8 cm. 1.2 x 0.8 x 1.2 cm submucosal fibroid present at the anterior uterine fundus. 1.2 x 0.9 x 0.7 cm intramural fibroid present at the posterior uterine fundus. 1.0 x 0.7 x 0.8 cm intramural fibroid present at the left uterine fundus. Endometrium Thickness: 10 mm.  No focal abnormality visualized. Right ovary Measurements: 3.6 x 1.7 x 2.1 cm. Normal appearance/no adnexal mass. Left ovary Measurements: 4.1 x 2.5 x 2.8 cm. 1.9 x 2.0 x 2.0 cm complex cyst, likely a hemorrhagic cyst with internal retractile clot. Pulsed Doppler evaluation  of both ovaries demonstrates normal low-resistance arterial and venous waveforms. Other findings No abnormal free fluid. IMPRESSION: 1. 2 cm complex left ovarian cyst, favored to reflect a hemorrhagic cyst. A short interval follow-up in 6-12 weeks to ensure resolution is suggested. 2. Fibroid uterus as above. 3. No other acute abnormality within the pelvis. No evidence for torsion. Electronically Signed   By: Jeannine Boga M.D.   On: 04/19/2018 00:57   US Pelvis Complete  Result Date: 04/19/2018 CLINICAL DATA:  Initial evaluation for acute left lower quadrant pain with vaginal bleeding. EXAM: TRANSABDOMINAL AND TRANSVAGINAL ULTRASOUND OF PELVIS DOPPLER ULTRASOUND OF OVARIES TECHNIQUE: Both transabdominal and transvaginal ultrasound examinations of the pelvis were performed. Transabdominal technique was performed for global imaging of the pelvis including uterus, ovaries, adnexal regions, and pelvic cul-de-sac. It was necessary to proceed with endovaginal exam following the transabdominal exam to visualize the uterus, endometrium, and ovaries. Color and duplex Doppler ultrasound was utilized to evaluate blood flow to the ovaries. COMPARISON:  None. FINDINGS: Uterus Measurements: 9.5 x 5.1 x 5.8 cm. 1.2 x 0.8 x 1.2 cm submucosal fibroid present at the anterior uterine fundus. 1.2 x 0.9 x 0.7 cm intramural fibroid present at the posterior uterine fundus. 1.0 x 0.7 x 0.8 cm intramural fibroid present at the left uterine fundus. Endometrium Thickness: 10 mm.  No focal abnormality visualized. Right ovary Measurements: 3.6 x 1.7 x 2.1 cm. Normal appearance/no adnexal mass. Left ovary Measurements: 4.1 x 2.5 x 2.8 cm. 1.9 x 2.0 x 2.0 cm complex cyst, likely a hemorrhagic cyst with internal retractile clot. Pulsed Doppler evaluation of both ovaries demonstrates normal low-resistance arterial and venous waveforms. Other findings No abnormal free fluid. IMPRESSION: 1. 2 cm complex left ovarian cyst, favored to  reflect a hemorrhagic cyst. A short interval follow-up in 6-12 weeks to ensure resolution is suggested. 2. Fibroid uterus as above. 3. No other acute abnormality within the pelvis. No evidence for torsion. Electronically Signed   By: Jeannine Boga M.D.   On: 04/19/2018 00:57   US Pelvic Doppler (torsion R/o Or Mass Arterial Flow)  Result Date: 04/19/2018 CLINICAL DATA:  Initial evaluation for acute left lower quadrant pain with vaginal bleeding. EXAM: TRANSABDOMINAL AND TRANSVAGINAL ULTRASOUND OF PELVIS DOPPLER ULTRASOUND OF OVARIES  TECHNIQUE: Both transabdominal and transvaginal ultrasound examinations of the pelvis were performed. Transabdominal technique was performed for global imaging of the pelvis including uterus, ovaries, adnexal regions, and pelvic cul-de-sac. It was necessary to proceed with endovaginal exam following the transabdominal exam to visualize the uterus, endometrium, and ovaries. Color and duplex Doppler ultrasound was utilized to evaluate blood flow to the ovaries. COMPARISON:  None. FINDINGS: Uterus Measurements: 9.5 x 5.1 x 5.8 cm. 1.2 x 0.8 x 1.2 cm submucosal fibroid present at the anterior uterine fundus. 1.2 x 0.9 x 0.7 cm intramural fibroid present at the posterior uterine fundus. 1.0 x 0.7 x 0.8 cm intramural fibroid present at the left uterine fundus. Endometrium Thickness: 10 mm.  No focal abnormality visualized. Right ovary Measurements: 3.6 x 1.7 x 2.1 cm. Normal appearance/no adnexal mass. Left ovary Measurements: 4.1 x 2.5 x 2.8 cm. 1.9 x 2.0 x 2.0 cm complex cyst, likely a hemorrhagic cyst with internal retractile clot. Pulsed Doppler evaluation of both ovaries demonstrates normal low-resistance arterial and venous waveforms. Other findings No abnormal free fluid. IMPRESSION: 1. 2 cm complex left ovarian cyst, favored to reflect a hemorrhagic cyst. A short interval follow-up in 6-12 weeks to ensure resolution is suggested. 2. Fibroid uterus as above. 3. No other acute  abnormality within the pelvis. No evidence for torsion. Electronically Signed   By: Jeannine Boga M.D.   On: 04/19/2018 00:57    ____________________________________________   PROCEDURES  Procedure(s) performed: None  Procedures  Critical Care performed: No  ____________________________________________   INITIAL IMPRESSION / ASSESSMENT AND PLAN / ED COURSE  As part of my medical decision making, I reviewed the following data within the electronic MEDICAL RECORD NUMBER Notes from prior ED visits and [place] Controlled Substance Database  This is a 42 year old female who comes into the hospital today with some abdominal pain and vaginal bleeding.  My differential diagnosis includes ovarian cyst, pelvic infection, urinary tract infection, diverticulitis.  The patient did have a CBC CMP urine, chlamydia and wet prep done.  The patient's blood work is unremarkable.  I did send her for an ultrasound of her pelvis because on exam she had more adnexal pain and abdominal pain.  The patient does have many fibroids and a hemorrhagic ovarian cyst.  Objective the patient a dose of Toradol for her pain and she seemed comfortable.  We talked about fibroids and getting them removed.  The patient's wet prep was unremarkable.  She will be discharged home and encouraged to follow-up with her OB/GYN.      ____________________________________________   FINAL CLINICAL IMPRESSION(S) / ED DIAGNOSES  Final diagnoses:  Abdominal pain  Fibroids  Hemorrhagic cyst of left ovary     ED Discharge Orders        Ordered    traMADol (ULTRAM) 50 MG tablet  Every 6 hours PRN     04/19/18 0143       Note:  This document was prepared using Dragon voice recognition software and may include unintentional dictation errors.    Loney Hering, MD 04/19/18 [phone]

## 2018-04-19 NOTE — ED Notes (Signed)
Pt to ultrasound

## 2018-06-11 ENCOUNTER — Encounter: Payer: Self-pay | Admitting: Family Medicine

## 2018-06-11 ENCOUNTER — Ambulatory Visit: Payer: Managed Care, Other (non HMO) | Admitting: Family Medicine

## 2018-06-11 VITALS — BP 132/90 | HR 78 | Temp 98.0°F | Resp 16 | Wt 200.0 lb

## 2018-06-11 DIAGNOSIS — I10 Essential (primary) hypertension: Secondary | ICD-10-CM

## 2018-06-11 NOTE — Patient Instructions (Signed)
DASH Eating Plan DASH stands for "Dietary Approaches to Stop Hypertension." The DASH eating plan is a healthy eating plan that has been shown to reduce high blood pressure (hypertension). It may also reduce your risk for type 2 diabetes, heart disease, and stroke. The DASH eating plan may also help with weight loss. What are tips for following this plan? General guidelines  Avoid eating more than 2,300 mg (milligrams) of salt (sodium) a day. If you have hypertension, you may need to reduce your sodium intake to 1,500 mg a day.  Limit alcohol intake to no more than 1 drink a day for nonpregnant women and 2 drinks a day for men. One drink equals 12 oz of beer, 5 oz of wine, or 1 oz of hard liquor.  Work with your health care provider to maintain a healthy body weight or to lose weight. Ask what an ideal weight is for you.  Get at least 30 minutes of exercise that causes your heart to beat faster (aerobic exercise) most days of the week. Activities may include walking, swimming, or biking.  Work with your health care provider or diet and nutrition specialist (dietitian) to adjust your eating plan to your individual calorie needs. Reading food labels  Check food labels for the amount of sodium per serving. Choose foods with less than 5 percent of the Daily Value of sodium. Generally, foods with less than 300 mg of sodium per serving fit into this eating plan.  To find whole grains, look for the word "whole" as the first word in the ingredient list. Shopping  Buy products labeled as "low-sodium" or "no salt added."  Buy fresh foods. Avoid canned foods and premade or frozen meals. Cooking  Avoid adding salt when cooking. Use salt-free seasonings or herbs instead of table salt or sea salt. Check with your health care provider or pharmacist before using salt substitutes.  Do not fry foods. Cook foods using healthy methods such as baking, boiling, grilling, and broiling instead.  Cook with  heart-healthy oils, such as olive, canola, soybean, or sunflower oil. Meal planning   Eat a balanced diet that includes: ? 5 or more servings of fruits and vegetables each day. At each meal, try to fill half of your plate with fruits and vegetables. ? Up to 6-8 servings of whole grains each day. ? Less than 6 oz of lean meat, poultry, or fish each day. A 3-oz serving of meat is about the same size as a deck of cards. One egg equals 1 oz. ? 2 servings of low-fat dairy each day. ? A serving of nuts, seeds, or beans 5 times each week. ? Heart-healthy fats. Healthy fats called Omega-3 fatty acids are found in foods such as flaxseeds and coldwater fish, like sardines, salmon, and mackerel.  Limit how much you eat of the following: ? Canned or prepackaged foods. ? Food that is high in trans fat, such as fried foods. ? Food that is high in saturated fat, such as fatty meat. ? Sweets, desserts, sugary drinks, and other foods with added sugar. ? Full-fat dairy products.  Do not salt foods before eating.  Try to eat at least 2 vegetarian meals each week.  Eat more home-cooked food and less restaurant, buffet, and fast food.  When eating at a restaurant, ask that your food be prepared with less salt or no salt, if possible. What foods are recommended? The items listed may not be a complete list. Talk with your dietitian about what   dietary choices are best for you. Grains Whole-grain or whole-wheat bread. Whole-grain or whole-wheat pasta. Brown rice. Oatmeal. Quinoa. Bulgur. Whole-grain and low-sodium cereals. Pita bread. Low-fat, low-sodium crackers. Whole-wheat flour tortillas. Vegetables Fresh or frozen vegetables (raw, steamed, roasted, or grilled). Low-sodium or reduced-sodium tomato and vegetable juice. Low-sodium or reduced-sodium tomato sauce and tomato paste. Low-sodium or reduced-sodium canned vegetables. Fruits All fresh, dried, or frozen fruit. Canned fruit in natural juice (without  added sugar). Meat and other protein foods Skinless chicken or turkey. Ground chicken or turkey. Pork with fat trimmed off. Fish and seafood. Egg whites. Dried beans, peas, or lentils. Unsalted nuts, nut butters, and seeds. Unsalted canned beans. Lean cuts of beef with fat trimmed off. Low-sodium, lean deli meat. Dairy Low-fat (1%) or fat-free (skim) milk. Fat-free, low-fat, or reduced-fat cheeses. Nonfat, low-sodium ricotta or cottage cheese. Low-fat or nonfat yogurt. Low-fat, low-sodium cheese. Fats and oils Soft margarine without trans fats. Vegetable oil. Low-fat, reduced-fat, or light mayonnaise and salad dressings (reduced-sodium). Canola, safflower, olive, soybean, and sunflower oils. Avocado. Seasoning and other foods Herbs. Spices. Seasoning mixes without salt. Unsalted popcorn and pretzels. Fat-free sweets. What foods are not recommended? The items listed may not be a complete list. Talk with your dietitian about what dietary choices are best for you. Grains Baked goods made with fat, such as croissants, muffins, or some breads. Dry pasta or rice meal packs. Vegetables Creamed or fried vegetables. Vegetables in a cheese sauce. Regular canned vegetables (not low-sodium or reduced-sodium). Regular canned tomato sauce and paste (not low-sodium or reduced-sodium). Regular tomato and vegetable juice (not low-sodium or reduced-sodium). Pickles. Olives. Fruits Canned fruit in a light or heavy syrup. Fried fruit. Fruit in cream or butter sauce. Meat and other protein foods Fatty cuts of meat. Ribs. Fried meat. Bacon. Sausage. Bologna and other processed lunch meats. Salami. Fatback. Hotdogs. Bratwurst. Salted nuts and seeds. Canned beans with added salt. Canned or smoked fish. Whole eggs or egg yolks. Chicken or turkey with skin. Dairy Whole or 2% milk, cream, and half-and-half. Whole or full-fat cream cheese. Whole-fat or sweetened yogurt. Full-fat cheese. Nondairy creamers. Whipped toppings.  Processed cheese and cheese spreads. Fats and oils Butter. Stick margarine. Lard. Shortening. Ghee. Bacon fat. Tropical oils, such as coconut, palm kernel, or palm oil. Seasoning and other foods Salted popcorn and pretzels. Onion salt, garlic salt, seasoned salt, table salt, and sea salt. Worcestershire sauce. Tartar sauce. Barbecue sauce. Teriyaki sauce. Soy sauce, including reduced-sodium. Steak sauce. Canned and packaged gravies. Fish sauce. Oyster sauce. Cocktail sauce. Horseradish that you find on the shelf. Ketchup. Mustard. Meat flavorings and tenderizers. Bouillon cubes. Hot sauce and Tabasco sauce. Premade or packaged marinades. Premade or packaged taco seasonings. Relishes. Regular salad dressings. Where to find more information:  National Heart, Lung, and Blood Institute: www.nhlbi.nih.gov  American Heart Association: www.heart.org Summary  The DASH eating plan is a healthy eating plan that has been shown to reduce high blood pressure (hypertension). It may also reduce your risk for type 2 diabetes, heart disease, and stroke.  With the DASH eating plan, you should limit salt (sodium) intake to 2,300 mg a day. If you have hypertension, you may need to reduce your sodium intake to 1,500 mg a day.  When on the DASH eating plan, aim to eat more fresh fruits and vegetables, whole grains, lean proteins, low-fat dairy, and heart-healthy fats.  Work with your health care provider or diet and nutrition specialist (dietitian) to adjust your eating plan to your individual   calorie needs. This information is not intended to replace advice given to you by your health care provider. Make sure you discuss any questions you have with your health care provider. Document Released: 11/28/2011 Document Revised: 12/02/2016 Document Reviewed: 12/02/2016 Elsevier Interactive Patient Education  2018 [employer]  

## 2018-06-11 NOTE — Assessment & Plan Note (Signed)
Initially uncontrolled, but better on recheck Well controlled at home Suspect some component of white coat syndrome Will continue HCTZ 12.5 mg daily Side effects resolved Recent BMP reviewed  f/u in 3 months

## 2018-06-11 NOTE — Progress Notes (Signed)
Patient: Martha Mendoza Female    DOB: 11/29/1976   42 y.o.   MRN: [id number] Visit Date: 06/11/2018  Today's Provider: Lavon Paganini, MD   I, Martha Clan, CMA, am acting as scribe for Lavon Paganini, MD.  Chief Complaint  Patient presents with  . Hypertension   Subjective:    HPI      Hypertension, follow-up:  BP Readings from Last 3 Encounters:  06/11/18 132/90  04/19/18 132/83  03/11/18 (!) 162/110    She was last seen for hypertension 3 months ago.  BP at that visit was 162/110. Management since that visit includes decreasing HCTZ to 12.5 mg due to side effects, including possible tinnitus. If tinnitus continues, PCP was going to consider changing HCTZ to amlodipine. She reports good compliance with treatment. She is not having side effects. Tinnitus has improved. She is exercising. 1-2 weekly. She is adherent to low salt diet.   Outside blood pressures are normal per pt. She is experiencing chest pain. Due to recent URI. Patient denies chest pressure/discomfort, claudication, dyspnea, exertional chest pressure/discomfort, fatigue, irregular heart beat, lower extremity edema, near-syncope, orthopnea, palpitations and syncope.   Cardiovascular risk factors include hypertension.  Use of agents associated with hypertension: none.     Weight trend: stable Wt Readings from Last 3 Encounters:  06/11/18 200 lb (90.7 kg)  04/18/18 205 lb (93 kg)  03/11/18 202 lb (91.6 kg)    Current diet: in general, a "healthy" diet    ------------------------------------------------------------------------    Allergies  Allergen Reactions  . Lisinopril Swelling    angioedema     Current Outpatient Medications:  .  hydrochlorothiazide (HYDRODIURIL) 12.5 MG tablet, Take 1 tablet (12.5 mg total) by mouth daily., Disp: 30 tablet, Rfl: 3 .  traMADol (ULTRAM) 50 MG tablet, Take 1 tablet (50 mg total) by mouth every 6 (six) hours as needed. (Patient not taking:  Reported on 06/11/2018), Disp: 12 tablet, Rfl: 0 .  valACYclovir (VALTREX) 1000 MG tablet, Take 1,000 mg by mouth 2 (two) times daily as needed., Disp: , Rfl:   Review of Systems  Constitutional: Negative for activity change, appetite change, chills, diaphoresis, fatigue, fever and unexpected weight change.  Respiratory: Positive for cough and chest tightness.   Cardiovascular: Positive for chest pain (from coughing). Negative for palpitations and leg swelling.  Gastrointestinal: Negative for abdominal pain.    Social History   Tobacco Use  . Smoking status: Never Smoker  . Smokeless tobacco: Never Used  Substance Use Topics  . Alcohol use: No   Objective:   BP 132/90 (BP Location: Left Arm, Cuff Size: Large)   Pulse 78   Temp 98 F (36.7 C) (Oral)   Resp 16   Wt 200 lb (90.7 kg)   LMP 05/23/2018   SpO2 96%   BMI 37.79 kg/m  Vitals:   06/11/18 1326 06/11/18 1402  BP: (!) 146/94 132/90  Pulse: 78   Resp: 16   Temp: 98 F (36.7 C)   TempSrc: Oral   SpO2: 96%   Weight: 200 lb (90.7 kg)      Physical Exam  Constitutional: She is oriented to person, place, and time. She appears well-developed and well-nourished. No distress.  HENT:  Head: Normocephalic and atraumatic.  Eyes: Conjunctivae are normal. No scleral icterus.  Cardiovascular: Normal rate, regular rhythm, normal heart sounds and intact distal pulses.  No murmur heard. Pulmonary/Chest: Effort normal and breath sounds normal. No respiratory distress. She has no wheezes.  She has no rales.  Musculoskeletal: She exhibits no edema.  Neurological: She is alert and oriented to person, place, and time.  Skin: Skin is warm and dry. Capillary refill takes less than 2 seconds. No rash noted.  Psychiatric: She has a normal mood and affect. Her behavior is normal.  Vitals reviewed.      Assessment & Plan:   Problem List Items Addressed This Visit      Cardiovascular and Mediastinum   Hypertension - Primary     Initially uncontrolled, but better on recheck Well controlled at home Suspect some component of white coat syndrome Will continue HCTZ 12.5 mg daily Side effects resolved Recent BMP reviewed  f/u in 3 months         Return in about 3 months (around 09/11/2018) for BP f/u.   The entirety of the information documented in the History of Present Illness, Review of Systems and Physical Exam were personally obtained by me. Portions of this information were initially documented by Raquel Sarna Ratchford, CMA and reviewed by me for thoroughness and accuracy.    Virginia Crews, MD, MPH [facility] 06/11/2018 2:10 PM

## 2018-09-11 ENCOUNTER — Ambulatory Visit: Payer: Managed Care, Other (non HMO) | Admitting: Family Medicine

## 2018-09-11 ENCOUNTER — Encounter: Payer: Self-pay | Admitting: Family Medicine

## 2018-09-11 VITALS — BP 152/94 | HR 86 | Temp 98.8°F | Wt 202.6 lb

## 2018-09-11 DIAGNOSIS — R7303 Prediabetes: Secondary | ICD-10-CM

## 2018-09-11 DIAGNOSIS — T753XXA Motion sickness, initial encounter: Secondary | ICD-10-CM

## 2018-09-11 DIAGNOSIS — I10 Essential (primary) hypertension: Secondary | ICD-10-CM

## 2018-09-11 MED ORDER — SCOPOLAMINE 1 MG/3DAYS TD PT72: 1.0000 | MEDICATED_PATCH | TRANSDERMAL | 0 refills | Status: DC

## 2018-09-11 NOTE — Assessment & Plan Note (Signed)
Discussed low carb diet and exercise Recheck A1c 

## 2018-09-11 NOTE — Progress Notes (Signed)
Patient: Martha Mendoza Female    DOB: 1976-10-27   42 y.o.   MRN: [id number] Visit Date: 09/11/2018  Today's Provider: Lavon Paganini, MD   Chief Complaint  Patient presents with  . Hypertension   Subjective:    HPI  Hypertension, follow-up:  BP Readings from Last 3 Encounters:  09/11/18 (!) 152/94  06/11/18 132/90  04/19/18 132/83   She was last seen for hypertension 1 month ago.  BP at that visit was 132/90. Management since that visit includes continue HCTZ 12.5 mg. She reports good compliance with treatment. She is not having side effects. She is exercising. 1-2 weekly. She is adherent to low salt diet.   Outside blood pressures are being checked at home. Patient states BP readings are fluctuating but most of the time in the normal range. This morning BP was 150/101. She reports taking TheraFlu.  She believes this is elevated 2/2 current URI. She is experiencing headaches and joint pain Patient denies chest pressure/discomfort, claudication, dyspnea, exertional chest pressure/discomfort, fatigue, irregular heart beat, lower extremity edema, near-syncope, orthopnea, palpitations and syncope.   Cardiovascular risk factors include hypertension.  Use of agents associated with hypertension: none               Weight trend: stable Wt Readings from Last 3 Encounters:  09/11/18 202 lb 9.6 oz (91.9 kg)  06/11/18 200 lb (90.7 kg)  04/18/18 205 lb (93 kg)    Current diet: in general, a "healthy" diet     Allergies  Allergen Reactions  . Lisinopril Swelling    angioedema     Current Outpatient Medications:  .  hydrochlorothiazide (HYDRODIURIL) 12.5 MG tablet, Take 1 tablet (12.5 mg total) by mouth daily., Disp: 30 tablet, Rfl: 3 .  valACYclovir (VALTREX) 1000 MG tablet, Take 1,000 mg by mouth 2 (two) times daily as needed., Disp: , Rfl:  .  traMADol (ULTRAM) 50 MG tablet, Take 1 tablet (50 mg total) by mouth every 6 (six) hours as needed. (Patient not taking:  Reported on 06/11/2018), Disp: 12 tablet, Rfl: 0  Review of Systems  Constitutional: Negative.   Respiratory: Negative.   Cardiovascular: Negative.   Musculoskeletal: Positive for arthralgias.  Neurological: Positive for headaches.    Social History   Tobacco Use  . Smoking status: Never Smoker  . Smokeless tobacco: Never Used  Substance Use Topics  . Alcohol use: No   Objective:   BP (!) 152/94 (BP Location: Right Arm, Patient Position: Sitting, Cuff Size: Large)   Pulse 86   Temp 98.8 F (37.1 C) (Oral)   Wt 202 lb 9.6 oz (91.9 kg)   SpO2 99%   BMI 38.28 kg/m  Vitals:   09/11/18 1327  BP: (!) 152/94  Pulse: 86  Temp: 98.8 F (37.1 C)  TempSrc: Oral  SpO2: 99%  Weight: 202 lb 9.6 oz (91.9 kg)     Physical Exam  Constitutional: She is oriented to person, place, and time. She appears well-developed and well-nourished. No distress.  HENT:  Head: Normocephalic and atraumatic.  Mouth/Throat: No oropharyngeal exudate.  Eyes: Conjunctivae are normal. No scleral icterus.  Neck: Neck supple. No thyromegaly present.  Cardiovascular: Normal rate, regular rhythm, normal heart sounds and intact distal pulses.  No murmur heard. Pulmonary/Chest: Effort normal and breath sounds normal. No respiratory distress. She has no wheezes. She has no rales.  Musculoskeletal: She exhibits no edema.  Lymphadenopathy:    She has no cervical adenopathy.  Neurological: She is  alert and oriented to person, place, and time.  Skin: Skin is warm and dry. Capillary refill takes less than 2 seconds. No rash noted.  Psychiatric: She has a normal mood and affect. Her behavior is normal.  Vitals reviewed.       Assessment & Plan:   Problem List Items Addressed This Visit      Cardiovascular and Mediastinum   Benign essential hypertension - Primary    Elevated today, but previously well controlled Could be elevated currently due to URI symptoms and over-the-counter medications Advised to  monitor at home and call in 2 weeks to let me know how it is running Continue HCTZ at current dose If continues to be elevated, may increase HCTZ to 25 mg daily Recheck BMP      Relevant Orders   Comprehensive metabolic panel     Other   Morbid obesity ([place])    Discussed diet and exercise Check screening lipid panel Management of hypertension and prediabetes as above      Relevant Orders   Lipid panel   Comprehensive metabolic panel   Hemoglobin A1c   Pre-diabetes    Discussed low-carb diet and exercise Recheck A1c      Relevant Orders   Hemoglobin A1c    Other Visit Diagnoses    Sea sickness, initial encounter         Meds ordered this encounter  Medications  . scopolamine (TRANSDERM-SCOP) 1 MG/3DAYS    Sig: Place 1 patch (1.5 mg total) onto the skin every 3 (three) days.    Dispense:  2 patch    Refill:  0      Return in about 3 months (around 12/11/2018) for chronic disease f/u.   The entirety of the information documented in the History of Present Illness, Review of Systems and Physical Exam were personally obtained by me. Portions of this information were initially documented by [name], CMA and reviewed by me for thoroughness and accuracy.    Virginia Crews, MD, MPH [facility] 09/11/2018 2:19 PM

## 2018-09-11 NOTE — Patient Instructions (Addendum)
Call or MyChart Message with BP readings for next 2 weeks  If blood pressure stays elevated, we may need to do a higher dose of HCTZ   DASH Eating Plan DASH stands for "Dietary Approaches to Stop Hypertension." The DASH eating plan is a healthy eating plan that has been shown to reduce high blood pressure (hypertension). It may also reduce your risk for type 2 diabetes, heart disease, and stroke. The DASH eating plan may also help with weight loss. What are tips for following this plan? General guidelines  Avoid eating more than 2,300 mg (milligrams) of salt (sodium) a day. If you have hypertension, you may need to reduce your sodium intake to 1,500 mg a day.  Limit alcohol intake to no more than 1 drink a day for nonpregnant women and 2 drinks a day for men. One drink equals 12 oz of beer, 5 oz of wine, or 1 oz of hard liquor.  Work with your health care provider to maintain a healthy body weight or to lose weight. Ask what an ideal weight is for you.  Get at least 30 minutes of exercise that causes your heart to beat faster (aerobic exercise) most days of the week. Activities may include walking, swimming, or biking.  Work with your health care provider or diet and nutrition specialist (dietitian) to adjust your eating plan to your individual calorie needs. Reading food labels  Check food labels for the amount of sodium per serving. Choose foods with less than 5 percent of the Daily Value of sodium. Generally, foods with less than 300 mg of sodium per serving fit into this eating plan.  To find whole grains, look for the word "whole" as the first word in the ingredient list. Shopping  Buy products labeled as "low-sodium" or "no salt added."  Buy fresh foods. Avoid canned foods and premade or frozen meals. Cooking  Avoid adding salt when cooking. Use salt-free seasonings or herbs instead of table salt or sea salt. Check with your health care provider or pharmacist before using salt  substitutes.  Do not fry foods. Cook foods using healthy methods such as baking, boiling, grilling, and broiling instead.  Cook with heart-healthy oils, such as olive, canola, soybean, or sunflower oil. Meal planning   Eat a balanced diet that includes: ? 5 or more servings of fruits and vegetables each day. At each meal, try to fill half of your plate with fruits and vegetables. ? Up to 6-8 servings of whole grains each day. ? Less than 6 oz of lean meat, poultry, or fish each day. A 3-oz serving of meat is about the same size as a deck of cards. One egg equals 1 oz. ? 2 servings of low-fat dairy each day. ? A serving of nuts, seeds, or beans 5 times each week. ? Heart-healthy fats. Healthy fats called Omega-3 fatty acids are found in foods such as flaxseeds and coldwater fish, like sardines, salmon, and mackerel.  Limit how much you eat of the following: ? Canned or prepackaged foods. ? Food that is high in trans fat, such as fried foods. ? Food that is high in saturated fat, such as fatty meat. ? Sweets, desserts, sugary drinks, and other foods with added sugar. ? Full-fat dairy products.  Do not salt foods before eating.  Try to eat at least 2 vegetarian meals each week.  Eat more home-cooked food and less restaurant, buffet, and fast food.  When eating at a restaurant, ask that your food  be prepared with less salt or no salt, if possible. What foods are recommended? The items listed may not be a complete list. Talk with your dietitian about what dietary choices are best for you. Grains Whole-grain or whole-wheat bread. Whole-grain or whole-wheat pasta. Brown rice. [place]row. Bulgur. Whole-grain and low-sodium cereals. Pita bread. Low-fat, low-sodium crackers. Whole-wheat flour tortillas. Vegetables Fresh or frozen vegetables (raw, steamed, roasted, or grilled). Low-sodium or reduced-sodium tomato and vegetable juice. Low-sodium or reduced-sodium tomato sauce and tomato  paste. Low-sodium or reduced-sodium canned vegetables. Fruits All fresh, dried, or frozen fruit. Canned fruit in natural juice (without added sugar). Meat and other protein foods Skinless chicken or [place]. Ground chicken or [place]. Pork with fat trimmed off. Fish and seafood. Egg whites. Dried beans, peas, or lentils. Unsalted nuts, nut butters, and seeds. Unsalted canned beans. Lean cuts of beef with fat trimmed off. Low-sodium, lean deli meat. Dairy Low-fat (1%) or fat-free (skim) milk. Fat-free, low-fat, or reduced-fat cheeses. Nonfat, low-sodium ricotta or cottage cheese. Low-fat or nonfat yogurt. Low-fat, low-sodium cheese. Fats and oils Soft margarine without trans fats. Vegetable oil. Low-fat, reduced-fat, or light mayonnaise and salad dressings (reduced-sodium). Canola, safflower, olive, soybean, and sunflower oils. Avocado. Seasoning and other foods Herbs. Spices. Seasoning mixes without salt. Unsalted popcorn and pretzels. Fat-free sweets. What foods are not recommended? The items listed may not be a complete list. Talk with your dietitian about what dietary choices are best for you. Grains Baked goods made with fat, such as croissants, muffins, or some breads. Dry pasta or rice meal packs. Vegetables Creamed or fried vegetables. Vegetables in a cheese sauce. Regular canned vegetables (not low-sodium or reduced-sodium). Regular canned tomato sauce and paste (not low-sodium or reduced-sodium). Regular tomato and vegetable juice (not low-sodium or reduced-sodium). Angie Fava. Olives. Fruits Canned fruit in a light or heavy syrup. Fried fruit. Fruit in cream or butter sauce. Meat and other protein foods Fatty cuts of meat. Ribs. Fried meat. Berniece Salines. Sausage. Bologna and other processed lunch meats. Salami. Fatback. Hotdogs. Bratwurst. Salted nuts and seeds. Canned beans with added salt. Canned or smoked fish. Whole eggs or egg yolks. Chicken or [place] with skin. Dairy Whole or 2% milk,  cream, and half-and-half. Whole or full-fat cream cheese. Whole-fat or sweetened yogurt. Full-fat cheese. Nondairy creamers. Whipped toppings. Processed cheese and cheese spreads. Fats and oils Butter. Stick margarine. Lard. Shortening. Ghee. Bacon fat. Tropical oils, such as coconut, palm kernel, or palm oil. Seasoning and other foods Salted popcorn and pretzels. Onion salt, garlic salt, seasoned salt, table salt, and sea salt. Worcestershire sauce. Tartar sauce. Barbecue sauce. Teriyaki sauce. Soy sauce, including reduced-sodium. Steak sauce. Canned and packaged gravies. Fish sauce. Oyster sauce. Cocktail sauce. Horseradish that you find on the shelf. Ketchup. Mustard. Meat flavorings and tenderizers. Bouillon cubes. Hot sauce and Tabasco sauce. Premade or packaged marinades. Premade or packaged taco seasonings. Relishes. Regular salad dressings. Where to find more information:  National Heart, Lung, and [place]: https://wilson-eaton.com/  American Heart Association: www.heart.org Summary  The DASH eating plan is a healthy eating plan that has been shown to reduce high blood pressure (hypertension). It may also reduce your risk for type 2 diabetes, heart disease, and stroke.  With the DASH eating plan, you should limit salt (sodium) intake to 2,300 mg a day. If you have hypertension, you may need to reduce your sodium intake to 1,500 mg a day.  When on the DASH eating plan, aim to eat more fresh fruits and vegetables, whole  grains, lean proteins, low-fat dairy, and heart-healthy fats.  Work with your health care provider or diet and nutrition specialist (dietitian) to adjust your eating plan to your individual calorie needs. This information is not intended to replace advice given to you by your health care provider. Make sure you discuss any questions you have with your health care provider. Document Released: 11/28/2011 Document Revised: 12/02/2016 Document Reviewed: 12/02/2016 Elsevier  Interactive Patient Education  [employer].

## 2018-09-11 NOTE — Assessment & Plan Note (Signed)
Elevated today, but previously well controlled Could be elevated currently due to URI symptoms and over-the-counter medications Advised to monitor at home and call in 2 weeks to let me know how it is running Continue HCTZ at current dose If continues to be elevated, may increase HCTZ to 25 mg daily Recheck BMP

## 2018-09-11 NOTE — Assessment & Plan Note (Signed)
Discussed diet and exercise Check screening lipid panel Management of hypertension and prediabetes as above

## 2018-09-23 ENCOUNTER — Telehealth: Payer: Self-pay

## 2018-09-23 LAB — COMPREHENSIVE METABOLIC PANEL
ALBUMIN: 4.2 g/dL (ref 3.5–5.5)
ALK PHOS: 60 IU/L (ref 39–117)
ALT: 10 IU/L (ref 0–32)
AST: 15 IU/L (ref 0–40)
Albumin/Globulin Ratio: 1.5 (ref 1.2–2.2)
BUN/Creatinine Ratio: 9 (ref 9–23)
BUN: 8 mg/dL (ref 6–24)
Bilirubin Total: 0.4 mg/dL (ref 0.0–1.2)
CALCIUM: 9.4 mg/dL (ref 8.7–10.2)
CO2: 24 mmol/L (ref 20–29)
CREATININE: 0.89 mg/dL (ref 0.57–1.00)
Chloride: 102 mmol/L (ref 96–106)
GFR calc Af Amer: 92 mL/min/{1.73_m2} (ref >59)
GFR, EST NON AFRICAN AMERICAN: 80 mL/min/{1.73_m2} (ref >59)
GLUCOSE: 113 mg/dL — AB (ref 65–99)
Globulin, Total: 2.8 g/dL (ref 1.5–4.5)
Potassium: 3.5 mmol/L (ref 3.5–5.2)
Sodium: 140 mmol/L (ref 134–144)
Total Protein: 7 g/dL (ref 6.0–8.5)

## 2018-09-23 LAB — LIPID PANEL
Chol/HDL Ratio: 2.9 ratio (ref 0.0–4.4)
Cholesterol, Total: 178 mg/dL (ref 100–199)
HDL: 61 mg/dL (ref >39)
LDL Calculated: 107 mg/dL — ABNORMAL HIGH (ref 0–99)
Triglycerides: 50 mg/dL (ref 0–149)
VLDL CHOLESTEROL CAL: 10 mg/dL (ref 5–40)

## 2018-09-23 LAB — HEMOGLOBIN A1C
ESTIMATED AVERAGE GLUCOSE: 128 mg/dL
HEMOGLOBIN A1C: 6.1 % — AB (ref 4.8–5.6)

## 2018-09-23 NOTE — Telephone Encounter (Signed)
-----   Message from Trinna Post, [place] sent at 09/23/2018 12:08 PM EDT ----- LDL cholesterol slightly higher than last year. CMET normal except for sugar, which is in prediabetic range at 6.1% A1c but is relatively stable from last year. Liver, kidney and electrolyte function look normal.

## 2018-09-23 NOTE — Telephone Encounter (Signed)
LMTCB

## 2018-09-24 NOTE — Telephone Encounter (Signed)
Viewed by Wynell Balloon on 09/23/2018 1:16 PM

## 2018-09-24 NOTE — Telephone Encounter (Signed)
Lm for patient to call if she has any questions and concerns

## 2018-12-03 ENCOUNTER — Encounter: Payer: Self-pay | Admitting: Family Medicine

## 2018-12-03 ENCOUNTER — Ambulatory Visit: Payer: Managed Care, Other (non HMO) | Admitting: Family Medicine

## 2018-12-03 VITALS — BP 128/82 | HR 82 | Temp 98.3°F | Wt 198.0 lb

## 2018-12-03 DIAGNOSIS — I10 Essential (primary) hypertension: Secondary | ICD-10-CM | POA: Diagnosis not present

## 2018-12-03 NOTE — Assessment & Plan Note (Signed)
Well-controlled Continue HCTZ 12.5 mg daily Home blood pressures are well controlled as well Reviewed recent labs with normal creatinine Follow-up at CPE

## 2018-12-03 NOTE — Patient Instructions (Signed)
DASH Eating Plan DASH stands for "Dietary Approaches to Stop Hypertension." The DASH eating plan is a healthy eating plan that has been shown to reduce high blood pressure (hypertension). It may also reduce your risk for type 2 diabetes, heart disease, and stroke. The DASH eating plan may also help with weight loss. What are tips for following this plan? General guidelines  Avoid eating more than 2,300 mg (milligrams) of salt (sodium) a day. If you have hypertension, you may need to reduce your sodium intake to 1,500 mg a day.  Limit alcohol intake to no more than 1 drink a day for nonpregnant women and 2 drinks a day for men. One drink equals 12 oz of beer, 5 oz of wine, or 1 oz of hard liquor.  Work with your health care provider to maintain a healthy body weight or to lose weight. Ask what an ideal weight is for you.  Get at least 30 minutes of exercise that causes your heart to beat faster (aerobic exercise) most days of the week. Activities may include walking, swimming, or biking.  Work with your health care provider or diet and nutrition specialist (dietitian) to adjust your eating plan to your individual calorie needs. Reading food labels  Check food labels for the amount of sodium per serving. Choose foods with less than 5 percent of the Daily Value of sodium. Generally, foods with less than 300 mg of sodium per serving fit into this eating plan.  To find whole grains, look for the word "whole" as the first word in the ingredient list. Shopping  Buy products labeled as "low-sodium" or "no salt added."  Buy fresh foods. Avoid canned foods and premade or frozen meals. Cooking  Avoid adding salt when cooking. Use salt-free seasonings or herbs instead of table salt or sea salt. Check with your health care provider or pharmacist before using salt substitutes.  Do not fry foods. Cook foods using healthy methods such as baking, boiling, grilling, and broiling instead.  Cook with  heart-healthy oils, such as olive, canola, soybean, or sunflower oil. Meal planning   Eat a balanced diet that includes: ? 5 or more servings of fruits and vegetables each day. At each meal, try to fill half of your plate with fruits and vegetables. ? Up to 6-8 servings of whole grains each day. ? Less than 6 oz of lean meat, poultry, or fish each day. A 3-oz serving of meat is about the same size as a deck of cards. One egg equals 1 oz. ? 2 servings of low-fat dairy each day. ? A serving of nuts, seeds, or beans 5 times each week. ? Heart-healthy fats. Healthy fats called Omega-3 fatty acids are found in foods such as flaxseeds and coldwater fish, like sardines, salmon, and mackerel.  Limit how much you eat of the following: ? Canned or prepackaged foods. ? Food that is high in trans fat, such as fried foods. ? Food that is high in saturated fat, such as fatty meat. ? Sweets, desserts, sugary drinks, and other foods with added sugar. ? Full-fat dairy products.  Do not salt foods before eating.  Try to eat at least 2 vegetarian meals each week.  Eat more home-cooked food and less restaurant, buffet, and fast food.  When eating at a restaurant, ask that your food be prepared with less salt or no salt, if possible. What foods are recommended? The items listed may not be a complete list. Talk with your dietitian about what   dietary choices are best for you. Grains Whole-grain or whole-wheat bread. Whole-grain or whole-wheat pasta. Brown rice. Oatmeal. Quinoa. Bulgur. Whole-grain and low-sodium cereals. Pita bread. Low-fat, low-sodium crackers. Whole-wheat flour tortillas. Vegetables Fresh or frozen vegetables (raw, steamed, roasted, or grilled). Low-sodium or reduced-sodium tomato and vegetable juice. Low-sodium or reduced-sodium tomato sauce and tomato paste. Low-sodium or reduced-sodium canned vegetables. Fruits All fresh, dried, or frozen fruit. Canned fruit in natural juice (without  added sugar). Meat and other protein foods Skinless chicken or turkey. Ground chicken or turkey. Pork with fat trimmed off. Fish and seafood. Egg whites. Dried beans, peas, or lentils. Unsalted nuts, nut butters, and seeds. Unsalted canned beans. Lean cuts of beef with fat trimmed off. Low-sodium, lean deli meat. Dairy Low-fat (1%) or fat-free (skim) milk. Fat-free, low-fat, or reduced-fat cheeses. Nonfat, low-sodium ricotta or cottage cheese. Low-fat or nonfat yogurt. Low-fat, low-sodium cheese. Fats and oils Soft margarine without trans fats. Vegetable oil. Low-fat, reduced-fat, or light mayonnaise and salad dressings (reduced-sodium). Canola, safflower, olive, soybean, and sunflower oils. Avocado. Seasoning and other foods Herbs. Spices. Seasoning mixes without salt. Unsalted popcorn and pretzels. Fat-free sweets. What foods are not recommended? The items listed may not be a complete list. Talk with your dietitian about what dietary choices are best for you. Grains Baked goods made with fat, such as croissants, muffins, or some breads. Dry pasta or rice meal packs. Vegetables Creamed or fried vegetables. Vegetables in a cheese sauce. Regular canned vegetables (not low-sodium or reduced-sodium). Regular canned tomato sauce and paste (not low-sodium or reduced-sodium). Regular tomato and vegetable juice (not low-sodium or reduced-sodium). Pickles. Olives. Fruits Canned fruit in a light or heavy syrup. Fried fruit. Fruit in cream or butter sauce. Meat and other protein foods Fatty cuts of meat. Ribs. Fried meat. Bacon. Sausage. Bologna and other processed lunch meats. Salami. Fatback. Hotdogs. Bratwurst. Salted nuts and seeds. Canned beans with added salt. Canned or smoked fish. Whole eggs or egg yolks. Chicken or turkey with skin. Dairy Whole or 2% milk, cream, and half-and-half. Whole or full-fat cream cheese. Whole-fat or sweetened yogurt. Full-fat cheese. Nondairy creamers. Whipped toppings.  Processed cheese and cheese spreads. Fats and oils Butter. Stick margarine. Lard. Shortening. Ghee. Bacon fat. Tropical oils, such as coconut, palm kernel, or palm oil. Seasoning and other foods Salted popcorn and pretzels. Onion salt, garlic salt, seasoned salt, table salt, and sea salt. Worcestershire sauce. Tartar sauce. Barbecue sauce. Teriyaki sauce. Soy sauce, including reduced-sodium. Steak sauce. Canned and packaged gravies. Fish sauce. Oyster sauce. Cocktail sauce. Horseradish that you find on the shelf. Ketchup. Mustard. Meat flavorings and tenderizers. Bouillon cubes. Hot sauce and Tabasco sauce. Premade or packaged marinades. Premade or packaged taco seasonings. Relishes. Regular salad dressings. Where to find more information:  National Heart, Lung, and Blood Institute: www.nhlbi.nih.gov  American Heart Association: www.heart.org Summary  The DASH eating plan is a healthy eating plan that has been shown to reduce high blood pressure (hypertension). It may also reduce your risk for type 2 diabetes, heart disease, and stroke.  With the DASH eating plan, you should limit salt (sodium) intake to 2,300 mg a day. If you have hypertension, you may need to reduce your sodium intake to 1,500 mg a day.  When on the DASH eating plan, aim to eat more fresh fruits and vegetables, whole grains, lean proteins, low-fat dairy, and heart-healthy fats.  Work with your health care provider or diet and nutrition specialist (dietitian) to adjust your eating plan to your individual   calorie needs. This information is not intended to replace advice given to you by your health care provider. Make sure you discuss any questions you have with your health care provider. Document Released: 11/28/2011 Document Revised: 12/02/2016 Document Reviewed: 12/02/2016 Elsevier Interactive Patient Education  2018 [employer]  

## 2018-12-03 NOTE — Progress Notes (Signed)
Patient: Martha Mendoza Female    DOB: 12-10-1976   42 y.o.   MRN: [id number] Visit Date: 12/03/2018  Today's Provider: Lavon Paganini, MD   Chief Complaint  Patient presents with  . Hypertension   Subjective:    I, [name], CMA, am acting as a Education administrator for Lavon Paganini, MD.  HPI  Hypertension, follow-up:     BP Readings from Last 3 Encounters:  09/11/18 (!) 152/94  06/11/18 132/90  04/19/18 132/83   Shewas last seen for hypertension 82monthago.  BP at that visit was152/94. Management since that visit includes advised to monitor BP at home and if continues to be elevated increase HCTZ to 25 mg. Shereports goodcompliance with treatment. Sheis nothaving side effects. Sheisexercising. 1-2 weekly. Sheisadherent to low salt diet.  Outside blood pressures arebeing checked at home occasionally. Sheis experiencing none. Patient denieschest pressure/discomfort, claudication, dyspnea, exertional chest pressure/discomfort, fatigue, irregular heart beat, lower extremity edema, near-syncope, orthopnea, palpitations and syncope.  Cardiovascular risk factors includehypertension.  Use of agents associated with hypertension:none   Weight trend:stable    Wt Readings from Last 3 Encounters:  09/11/18 202 lb 9.6 oz (91.9 kg)  06/11/18 200 lb (90.7 kg)  04/18/18 205 lb (93 kg)    Current diet:in general, a "healthy" diet  Allergies  Allergen Reactions  . Lisinopril Swelling    angioedema     Current Outpatient Medications:  .  fluticasone (FLONASE) 50 MCG/ACT nasal spray, Place into both nostrils daily., Disp: , Rfl:  .  hydrochlorothiazide (HYDRODIURIL) 12.5 MG tablet, Take 1 tablet (12.5 mg total) by mouth daily., Disp: 30 tablet, Rfl: 3 .  traMADol (ULTRAM) 50 MG tablet, Take 1 tablet (50 mg total) by mouth every 6 (six) hours as needed., Disp: 12 tablet, Rfl: 0 .  valACYclovir (VALTREX) 1000 MG tablet, Take 1,000 mg by mouth 2  (two) times daily as needed., Disp: , Rfl:   Review of Systems  Constitutional: Negative.   Respiratory: Negative.   Cardiovascular: Negative.   Musculoskeletal: Negative.     Social History   Tobacco Use  . Smoking status: Never Smoker  . Smokeless tobacco: Never Used  Substance Use Topics  . Alcohol use: No      Objective:   BP 128/82 (BP Location: Right Arm, Patient Position: Sitting, Cuff Size: Normal)   Pulse 82   Temp 98.3 F (36.8 C) (Oral)   Wt 198 lb (89.8 kg)   SpO2 99%   BMI 37.41 kg/m  Vitals:   12/03/18 1344  BP: 128/82  Pulse: 82  Temp: 98.3 F (36.8 C)  TempSrc: Oral  SpO2: 99%  Weight: 198 lb (89.8 kg)     Physical Exam Vitals signs reviewed.  Constitutional:      General: She is not in acute distress.    Appearance: She is not ill-appearing.  HENT:     Head: Normocephalic and atraumatic.     Mouth/Throat:     Pharynx: Oropharynx is clear.  Eyes:     General: No scleral icterus.    Conjunctiva/sclera: Conjunctivae normal.  Neck:     Musculoskeletal: Neck supple.  Cardiovascular:     Rate and Rhythm: Normal rate and regular rhythm.     Pulses: Normal pulses.     Heart sounds: Normal heart sounds. No murmur.  Pulmonary:     Effort: Pulmonary effort is normal. No respiratory distress.     Breath sounds: Normal breath sounds. No wheezing.  Musculoskeletal:  Right lower leg: No edema.     Left lower leg: No edema.  Lymphadenopathy:     Cervical: No cervical adenopathy.  Skin:    General: Skin is warm and dry.     Capillary Refill: Capillary refill takes less than 2 seconds.     Findings: No rash.  Neurological:     Mental Status: She is alert and oriented to person, place, and time.  Psychiatric:        Mood and Affect: Mood normal.        Thought Content: Thought content normal.         Assessment & Plan   Problem List Items Addressed This Visit      Cardiovascular and Mediastinum   Benign essential hypertension -  Primary    Well-controlled Continue HCTZ 12.5 mg daily Home blood pressures are well controlled as well Reviewed recent labs with normal creatinine Follow-up at CPE          Return in about 3 months (around 03/04/2019) for CPE (after 3/20).   The entirety of the information documented in the History of Present Illness, Review of Systems and Physical Exam were personally obtained by me. Portions of this information were initially documented by [name], CMA and reviewed by me for thoroughness and accuracy.    Virginia Crews, MD, MPH [facility] 12/03/2018 2:08 PM

## 2018-12-11 ENCOUNTER — Ambulatory Visit: Payer: Managed Care, Other (non HMO) | Admitting: Family Medicine

## 2019-02-20 ENCOUNTER — Other Ambulatory Visit: Payer: Self-pay | Admitting: Family Medicine

## 2019-03-02 ENCOUNTER — Telehealth: Payer: Self-pay | Admitting: Family Medicine

## 2019-03-02 ENCOUNTER — Other Ambulatory Visit: Payer: Self-pay

## 2019-03-02 DIAGNOSIS — Z1159 Encounter for screening for other viral diseases: Secondary | ICD-10-CM

## 2019-03-02 DIAGNOSIS — Z111 Encounter for screening for respiratory tuberculosis: Secondary | ICD-10-CM

## 2019-03-02 NOTE — Telephone Encounter (Signed)
Pt is also needing a Hep B Titer included on the current lab order.  Please call pt back when lab order is ready.  Thanks, [employer]

## 2019-03-03 ENCOUNTER — Ambulatory Visit: Payer: Managed Care, Other (non HMO) | Admitting: Family Medicine

## 2019-03-03 NOTE — Telephone Encounter (Signed)
Lab slip printed at the front desk. Left patient a message advising her.

## 2019-03-03 NOTE — Telephone Encounter (Signed)
Yes ok to add Hep B surface antigen, anti-Hep B surface and core antibodies

## 2019-03-03 NOTE — Telephone Encounter (Signed)
Yes looks good

## 2019-03-05 ENCOUNTER — Other Ambulatory Visit: Payer: Self-pay | Admitting: Family Medicine

## 2019-03-05 ENCOUNTER — Other Ambulatory Visit: Payer: Self-pay

## 2019-03-05 ENCOUNTER — Ambulatory Visit
Admission: RE | Admit: 2019-03-05 | Discharge: 2019-03-05 | Disposition: A | Discharge status: Home / Self Care | Payer: Managed Care, Other (non HMO) | Source: Ambulatory Visit | Attending: Family Medicine | Admitting: Family Medicine

## 2019-03-05 ENCOUNTER — Ambulatory Visit
Admission: RE | Admit: 2019-03-05 | Discharge: 2019-03-05 | Disposition: A | Discharge status: Home / Self Care | Payer: Managed Care, Other (non HMO) | Attending: Family Medicine | Admitting: Family Medicine

## 2019-03-05 DIAGNOSIS — R7612 Nonspecific reaction to cell mediated immunity measurement of gamma interferon antigen response without active tuberculosis: Secondary | ICD-10-CM | POA: Diagnosis present

## 2019-03-05 LAB — HEPATITIS B SURFACE ANTIGEN: Hepatitis B Surface Ag: NEGATIVE

## 2019-03-05 LAB — HEPATITIS B CORE AB W/REFLEX: Hep B Core Total Ab: NEGATIVE

## 2019-03-05 LAB — QUANTIFERON-TB GOLD PLUS
QUANTIFERON NIL VALUE: 0.17 [IU]/mL
QUANTIFERON TB1 AG VALUE: 10 [IU]/mL
QUANTIFERON TB2 AG VALUE: 9.74 [IU]/mL
QUANTIFERON-TB GOLD PLUS: POSITIVE — AB

## 2019-03-05 LAB — HEPATITIS B CORE ANTIBODY, IGM: HEP B C IGM: NEGATIVE

## 2019-03-06 LAB — SPECIMEN STATUS REPORT

## 2019-03-06 LAB — HEPATITIS B SURFACE ANTIBODY,QUALITATIVE: HEP B SURFACE AB, QUAL: REACTIVE

## 2019-03-08 ENCOUNTER — Telehealth: Payer: Self-pay

## 2019-03-08 NOTE — Telephone Encounter (Signed)
-----   Message from Virginia Crews, MD sent at 03/08/2019  9:15 AM EDT ----- Positive Hep B immunity.  We need to send CXR to health department as well

## 2019-03-08 NOTE — Telephone Encounter (Signed)
-----   Message from Virginia Crews, MD sent at 03/08/2019  9:14 AM EDT ----- Normal CXR

## 2019-03-08 NOTE — Telephone Encounter (Signed)
Patient advised. Copy of results left at front desk for pick up.

## 2019-03-17 ENCOUNTER — Encounter: Payer: Managed Care, Other (non HMO) | Admitting: Family Medicine

## 2019-06-08 ENCOUNTER — Other Ambulatory Visit: Payer: Self-pay

## 2019-06-08 ENCOUNTER — Encounter: Payer: Self-pay | Admitting: Family Medicine

## 2019-06-08 ENCOUNTER — Other Ambulatory Visit: Payer: Self-pay | Admitting: Family Medicine

## 2019-06-08 ENCOUNTER — Ambulatory Visit: Payer: Managed Care, Other (non HMO) | Admitting: Family Medicine

## 2019-06-08 VITALS — BP 152/92 | HR 89 | Temp 99.7°F | Resp 16 | Wt 203.0 lb

## 2019-06-08 DIAGNOSIS — G44209 Tension-type headache, unspecified, not intractable: Secondary | ICD-10-CM

## 2019-06-08 DIAGNOSIS — F419 Anxiety disorder, unspecified: Secondary | ICD-10-CM

## 2019-06-08 DIAGNOSIS — R0789 Other chest pain: Secondary | ICD-10-CM | POA: Diagnosis not present

## 2019-06-08 DIAGNOSIS — I10 Essential (primary) hypertension: Secondary | ICD-10-CM

## 2019-06-08 MED ORDER — AMLODIPINE BESYLATE 2.5 MG PO TABS: 2.5000 mg | ORAL_TABLET | Freq: Every day | ORAL | 1 refills | Status: DC

## 2019-06-08 NOTE — Patient Instructions (Signed)
.   Please review the attached list of medications and notify my office if there are any errors.   . Please bring all of your medications to every appointment so we can make sure that our medication list is the same as yours.   

## 2019-06-08 NOTE — Progress Notes (Signed)
Patient: Martha Mendoza Female    DOB: 09-Sep-1976   43 y.o.   MRN: [id number] Visit Date: 06/08/2019  Today's Provider: Lelon Huh, MD   Chief Complaint  Patient presents with  . Blood Pressure Check   Subjective:     HPI  Elevated Blood pressure: Patient comes in today complaining of elevated blood pressure reading for the past month. Her highest reading was 160/101. Patient also reports having more headaches, blurred, mild chest pressure under the left breast.   Takes occasional tylenol, rarely takes Aleve.. No dietary changes.    Allergies  Allergen Reactions  . Lisinopril Swelling    angioedema     Current Outpatient Medications:  .  fluticasone (FLONASE) 50 MCG/ACT nasal spray, Place into both nostrils daily., Disp: , Rfl:  .  hydrochlorothiazide (HYDRODIURIL) 12.5 MG tablet, Take 1 tablet by mouth once daily, Disp: 30 tablet, Rfl: 2 .  valACYclovir (VALTREX) 1000 MG tablet, Take 1,000 mg by mouth 2 (two) times daily as needed., Disp: , Rfl:  .  traMADol (ULTRAM) 50 MG tablet, Take 1 tablet (50 mg total) by mouth every 6 (six) hours as needed. (Patient not taking: Reported on 06/08/2019), Disp: 12 tablet, Rfl: 0  Review of Systems  Constitutional: Negative for appetite change, chills, fatigue and fever.  Respiratory: Negative for chest tightness and shortness of breath.   Cardiovascular: Positive for chest pain (pressure in chest). Negative for palpitations.  Gastrointestinal: Negative for abdominal pain, nausea and vomiting.  Neurological: Positive for headaches. Negative for dizziness and weakness.    Social History   Tobacco Use  . Smoking status: Never Smoker  . Smokeless tobacco: Never Used  Substance Use Topics  . Alcohol use: No      Objective:   BP (!) 152/92 (BP Location: Right Arm, Cuff Size: Large)   Pulse 89   Temp 99.7 F (37.6 C) (Oral)   Resp 16   Wt 203 lb (92.1 kg)   SpO2 98% Comment: room air  BMI 38.36 kg/m  Vitals:   06/08/19  1552 06/08/19 1555  BP: (!) 158/90 (!) 152/92  Pulse: 89   Resp: 16   Temp: 99.9 F (37.7 C) 99.7 F (37.6 C)  TempSrc: Oral Oral  SpO2: 98%   Weight: 203 lb (92.1 kg)      Physical Exam  General Appearance:    Alert, cooperative, no distress, obese  Eyes:    PERRL, conjunctiva/corneas clear, EOM's intact       Lungs:     Clear to auscultation bilaterally, respirations unlabored  Heart:    Regular rate and rhythm  Neurologic:   Awake, alert, oriented x 3. No apparent focal neurological           defect.       EKG - NSR no ischemic changes.     Assessment & Plan    1. Benign essential hypertension Uncontrolled. Add low dose of amlodipine 2.5mg  recheck with Dr. Brita Romp in 1 month  2. Anxiety She attributes this to studying for MCATs and is likely aggravating hypertension.   3. Tension headache Likely secondary to hypertension.   4. Sensation of chest pressure Not at all typical for cardiac angina with unremarkable EKG today.     The entirety of the information documented in the History of Present Illness, Review of Systems and Physical Exam were personally obtained by me. Portions of this information were initially documented by Meyer Cory, CMA and reviewed by me  for thoroughness and accuracy.   Lelon Huh, MD  [facility]

## 2019-06-09 ENCOUNTER — Encounter: Payer: Self-pay | Admitting: Family Medicine

## 2019-06-10 NOTE — Telephone Encounter (Signed)
L.O.V. 06/08/2019.

## 2019-06-15 ENCOUNTER — Telehealth: Payer: Self-pay | Admitting: Family Medicine

## 2019-06-15 NOTE — Telephone Encounter (Signed)
Pt states she has been out of work since she was seen on 06/08/2019 by Dr. Caryn Section.  States she went to [facility] on 06/13/2019 and was tested for COVID (no results yet).  Her employer is needing FMLA paperwork completed from date 06/08/2019.  She has not returned to work states she is still having the same symptoms as she was on 06/08/2019

## 2019-06-17 NOTE — Telephone Encounter (Signed)
Please see if she has heard anything from urgent care about covid test result. Date she can return to work depends on this result.

## 2019-06-18 NOTE — Telephone Encounter (Signed)
Spoke with the patient and she reports that she called yesterday, and they still don't have the results. She reports that she will call us as soon as she finds out so we can complete the paperwork.

## 2019-06-23 ENCOUNTER — Encounter: Payer: Self-pay | Admitting: Family Medicine

## 2019-06-23 ENCOUNTER — Telehealth: Payer: Self-pay

## 2019-06-23 ENCOUNTER — Encounter (INDEPENDENT_AMBULATORY_CARE_PROVIDER_SITE_OTHER): Payer: Self-pay

## 2019-06-23 ENCOUNTER — Ambulatory Visit (INDEPENDENT_AMBULATORY_CARE_PROVIDER_SITE_OTHER): Payer: Managed Care, Other (non HMO) | Admitting: Family Medicine

## 2019-06-23 ENCOUNTER — Telehealth: Payer: Self-pay | Admitting: Family Medicine

## 2019-06-23 VITALS — BP 142/81 | HR 103 | Temp 101.5°F

## 2019-06-23 DIAGNOSIS — R509 Fever, unspecified: Secondary | ICD-10-CM

## 2019-06-23 DIAGNOSIS — I10 Essential (primary) hypertension: Secondary | ICD-10-CM | POA: Diagnosis not present

## 2019-06-23 DIAGNOSIS — Z20822 Contact with and (suspected) exposure to covid-19: Secondary | ICD-10-CM

## 2019-06-23 DIAGNOSIS — R6889 Other general symptoms and signs: Secondary | ICD-10-CM | POA: Diagnosis not present

## 2019-06-23 DIAGNOSIS — G44229 Chronic tension-type headache, not intractable: Secondary | ICD-10-CM | POA: Diagnosis not present

## 2019-06-23 NOTE — Telephone Encounter (Signed)
-----   Message from Virginia Crews, MD sent at 06/23/2019  4:05 PM EDT ----- Please schedule for COVID testing OP Fever x2 wks, headache, fatigue

## 2019-06-23 NOTE — Progress Notes (Signed)
Patient: Martha Mendoza Female    DOB: 10/30/1976   43 y.o.   MRN: [id number] Visit Date: 06/24/2019  Today's Provider: Lavon Paganini, MD   Chief Complaint  Patient presents with  . URI   Subjective:    I, Porsha McClurkin CMA, am acting as a scribe for Lavon Paganini, MD.   Virtual Visit via Video Note  I connected with Xin Klawitter on 06/24/19 at  3:40 PM EDT by a video enabled telemedicine application and verified that I am speaking with the correct person using two identifiers.   Patient location: home Provider location: [place] involved in the visit: patient, provider   I discussed the limitations of evaluation and management by telemedicine and the availability of in person appointments. The patient expressed understanding and agreed to proceed.  URI  This is a recurrent problem. The current episode started 1 to 4 weeks ago. The problem has been unchanged. The maximum temperature recorded prior to her arrival was 101 - 101.9 F (99.5-101.8). The fever has been present for 5 days or more. Associated symptoms include headaches. Pertinent negatives include no congestion, coughing, nausea, sinus pain, sore throat or vomiting. Associated symptoms comments: Fever and chills . She has tried acetaminophen, increased fluids and sleep for the symptoms. The treatment provided mild relief.  Patient states she has been waking up in the middle of the night due to sweating.   Symptoms of fever, chills, myalgias, headaches, fatigue have been present for more than 2 weeks now.  After she got a negative COVID test back from an urgent care visit in 06/13/2019, she did return to work this week.  States she reported to her supervisor that she continued to have fevers but was told it was okay to be at work as she had a negative COVID test.  She denies any urinary, abdominal, respiratory cardiac symptoms.  She denies any known tick bites or rashes.  Allergies  Allergen  Reactions  . Lisinopril Swelling    angioedema     Current Outpatient Medications:  .  amLODipine (NORVASC) 2.5 MG tablet, Take 1 tablet (2.5 mg total) by mouth daily., Disp: 30 tablet, Rfl: 1 .  fluticasone (FLONASE) 50 MCG/ACT nasal spray, Place into both nostrils daily., Disp: , Rfl:  .  hydrochlorothiazide (HYDRODIURIL) 12.5 MG tablet, Take 1 tablet by mouth once daily, Disp: 90 tablet, Rfl: 1 .  valACYclovir (VALTREX) 1000 MG tablet, Take 1,000 mg by mouth 2 (two) times daily as needed., Disp: , Rfl:  .  traMADol (ULTRAM) 50 MG tablet, Take 1 tablet (50 mg total) by mouth every 6 (six) hours as needed. (Patient not taking: Reported on 06/08/2019), Disp: 12 tablet, Rfl: 0  Review of Systems  Constitutional: Positive for appetite change, chills, fatigue and fever.  HENT: Negative for congestion, sinus pressure, sinus pain and sore throat.   Eyes: Positive for pain.  Respiratory: Negative for cough, chest tightness and shortness of breath.   Cardiovascular: Negative.   Gastrointestinal: Negative for nausea and vomiting.  Musculoskeletal: Negative.   Neurological: Positive for dizziness and headaches.    Social History   Tobacco Use  . Smoking status: Never Smoker  . Smokeless tobacco: Never Used  Substance Use Topics  . Alcohol use: No      Objective:   BP (!) 142/81 (BP Location: Left Arm, Patient Position: Sitting, Cuff Size: Normal)   Pulse (!) 103   Temp (!) 101.5 F (38.6 C) (Oral)  Vitals:  06/23/19 1334  BP: (!) 142/81  Pulse: (!) 103  Temp: (!) 101.5 F (38.6 C)  TempSrc: Oral     Physical Exam Vitals signs reviewed.  Constitutional:      General: She is not in acute distress.    Appearance: She is ill-appearing and diaphoretic. She is not toxic-appearing.  HENT:     Head: Normocephalic and atraumatic.  Eyes:     Conjunctiva/sclera: Conjunctivae normal.  Pulmonary:     Effort: Pulmonary effort is normal. No respiratory distress.  Neurological:      Mental Status: She is alert and oriented to person, place, and time. Mental status is at baseline.  Psychiatric:        Mood and Affect: Mood normal.        Behavior: Behavior normal.     No results found for any visits on 06/23/19.     Assessment & Plan    I discussed the assessment and treatment plan with the patient. The patient was provided an opportunity to ask questions and all were answered. The patient agreed with the plan and demonstrated an understanding of the instructions.   The patient was advised to call back or seek an in-person evaluation if the symptoms worsen or if the condition fails to improve as anticipated.  1. Suspected Covid-19 Virus Infection 2. Chronic tension-type headache, not intractable 3. Fever, unspecified fever cause -Ongoing problem for more than 2 weeks -Discussed with patient that sensitivity of COVID testing is somewhat low and is dependent on collection method as well - Given her constellation of symptoms, I am still concerned that she could have a COVID-19 infection, especially given duration - Also discussed other possible etiologies and discussed strict return precautions if she develops any other symptoms -Also concern for possible tickborne illness, but patient denies any possible exposure or rashes -If COVID test is again negative, would consider getting chest x-ray, UA, CBC, CMP, and possibly labs for tickborne illness as well as ESR to look for other sources of this continuous fever - Discussed the importance of self quarantine and for other household members to quarantine as well -She cannot return to work until she is at least 3 days fever free without medication -Discussed symptomatic management and return precautions -MyChart home monitoring for COVID - Temperature monitoring; Future  4. Benign essential hypertension -Unclear if patient's elevation in blood pressure is the cause of her headache or resultant of her headache -I will not  change any medications currently, so she can continue HCTZ 12.5 mg daily and amlodipine 2.5 mg daily -Reviewed recent lipid panel - Advised on home monitoring of blood pressure -If continues to be elevated, would consider dose increase of either of her 2 medications   Return if symptoms worsen or fail to improve.   The entirety of the information documented in the History of Present Illness, Review of Systems and Physical Exam were personally obtained by me. Portions of this information were initially documented by [name] and [place], CMA and reviewed by me for thoroughness and accuracy.    Nolon Yellin, Dionne Bucy, MD MPH [facility]

## 2019-06-23 NOTE — Telephone Encounter (Signed)
PEC Courtesy call -   Patient reports pCP appt today; testing has been scheduled for tomorrow, 06/24/19 @ 9:30 am  -  [facility] in [place], [place].  Patient reports questionnaire answers were from this morning before her PCP appt. She states she has not gotten worse.    Encouraged patient to continue pushing fluids; can take OTC Tylenol WS 500 mg for fever; aching - reporting 101.8 as highest temp thus far. Advised to contact PCP if there are any changes.  Patient stated she understood and had no further questions.

## 2019-06-23 NOTE — Telephone Encounter (Signed)
Pt referred for covid-19 testing by Dr. Brita Romp.  Scheduled for tomorrow at the [facility] in [place] at 9:30. Advised that this is a drive thru site and she should wear a mask, stay in car with windows rolled up until time for testing. Pt voiced understanding,

## 2019-06-24 ENCOUNTER — Encounter (INDEPENDENT_AMBULATORY_CARE_PROVIDER_SITE_OTHER): Payer: Self-pay

## 2019-06-24 ENCOUNTER — Encounter: Payer: Self-pay | Admitting: Family Medicine

## 2019-06-24 ENCOUNTER — Other Ambulatory Visit: Payer: Managed Care, Other (non HMO)

## 2019-06-24 DIAGNOSIS — R509 Fever, unspecified: Secondary | ICD-10-CM

## 2019-06-24 DIAGNOSIS — Z20822 Contact with and (suspected) exposure to covid-19: Secondary | ICD-10-CM

## 2019-06-25 ENCOUNTER — Encounter (INDEPENDENT_AMBULATORY_CARE_PROVIDER_SITE_OTHER): Payer: Self-pay

## 2019-06-26 ENCOUNTER — Encounter (INDEPENDENT_AMBULATORY_CARE_PROVIDER_SITE_OTHER): Payer: Self-pay

## 2019-06-26 LAB — NOVEL CORONAVIRUS, NAA: SARS-CoV-2, NAA: NOT DETECTED

## 2019-06-27 ENCOUNTER — Encounter (INDEPENDENT_AMBULATORY_CARE_PROVIDER_SITE_OTHER): Payer: Self-pay

## 2019-06-28 ENCOUNTER — Encounter (INDEPENDENT_AMBULATORY_CARE_PROVIDER_SITE_OTHER): Payer: Self-pay

## 2019-06-29 ENCOUNTER — Encounter (INDEPENDENT_AMBULATORY_CARE_PROVIDER_SITE_OTHER): Payer: Self-pay

## 2019-06-30 ENCOUNTER — Ambulatory Visit
Admission: RE | Admit: 2019-06-30 | Discharge: 2019-06-30 | Disposition: A | Discharge status: Home / Self Care | Payer: Managed Care, Other (non HMO) | Source: Ambulatory Visit | Attending: Family Medicine | Admitting: Family Medicine

## 2019-06-30 ENCOUNTER — Encounter: Payer: Self-pay | Admitting: Family Medicine

## 2019-06-30 ENCOUNTER — Telehealth: Payer: Self-pay | Admitting: *Deleted

## 2019-06-30 ENCOUNTER — Encounter (INDEPENDENT_AMBULATORY_CARE_PROVIDER_SITE_OTHER): Payer: Self-pay

## 2019-06-30 ENCOUNTER — Telehealth: Payer: Self-pay

## 2019-06-30 DIAGNOSIS — R509 Fever, unspecified: Secondary | ICD-10-CM | POA: Insufficient documentation

## 2019-06-30 NOTE — Telephone Encounter (Signed)
Attempted to contact pt due to my chart companion response 06/30/2019 of new cough; left message on voicemail for pt to call back [phone].

## 2019-06-30 NOTE — Telephone Encounter (Signed)
Phone message left on M. Garners RN VM at [facility] re: results.  Message forwarded to M. Whitney Bingaman Therapist, sports. Attempted TC x 2; message left x 2. Aileen Fass, RN   TC from patient. Discussed negative HIV/RPR results.  Will fax results to PCP per patient request. Aileen Fass, RN

## 2019-06-30 NOTE — Telephone Encounter (Signed)
Contacted pt regarding her cough; she says that she has a small amount of phlegm in the morning, and certain odors make her cough; recommendations: : COUGH MEDICINES:  * OTC COUGH SYRUPS: The most common cough suppressant in OTC cough medications is dextromethorphan. Often the letters 'DM' appear in the name.  * OTC COUGH DROPS: Cough drops can help a lot, especially for mild coughs. They reduce coughing by soothing your irritated throat and removing that tickle sensation in the back of the throat. Cough drops also have the advantage of portability - you can carry them with you.  .      8: CAUTION - DEXTROMETHORPHAN:  * Do not try to completely suppress coughs that produce mucus and phlegm. Remember that coughing is helpful in bringing up mucus from the lungs and preventing pneumonia.  Pt also advised to read package insert prior to taking medications, and she can discuss cough medications with her pharmacist; pt also advised to contact her PCP for further recommendations; she states that she prefers not to take cough medication.

## 2019-07-01 ENCOUNTER — Telehealth: Payer: Self-pay

## 2019-07-01 ENCOUNTER — Encounter (INDEPENDENT_AMBULATORY_CARE_PROVIDER_SITE_OTHER): Payer: Self-pay

## 2019-07-01 LAB — CBC WITH DIFFERENTIAL/PLATELET
Basophils Absolute: 0 10*3/uL (ref 0.0–0.2)
Basos: 1 %
EOS (ABSOLUTE): 0.1 10*3/uL (ref 0.0–0.4)
Eos: 2 %
Hematocrit: 35.7 % (ref 34.0–46.6)
Hemoglobin: 11 g/dL — ABNORMAL LOW (ref 11.1–15.9)
Immature Grans (Abs): 0 10*3/uL (ref 0.0–0.1)
Immature Granulocytes: 0 %
Lymphocytes Absolute: 2 10*3/uL (ref 0.7–3.1)
Lymphs: 34 %
MCH: 19.1 pg — ABNORMAL LOW (ref 26.6–33.0)
MCHC: 30.8 g/dL — ABNORMAL LOW (ref 31.5–35.7)
MCV: 62 fL — ABNORMAL LOW (ref 79–97)
Monocytes Absolute: 0.5 10*3/uL (ref 0.1–0.9)
Monocytes: 9 %
Neutrophils Absolute: 3.1 10*3/uL (ref 1.4–7.0)
Neutrophils: 54 %
Platelets: 431 10*3/uL (ref 150–450)
RBC: 5.76 x10E6/uL — ABNORMAL HIGH (ref 3.77–5.28)
RDW: 14.5 % (ref 11.7–15.4)
WBC: 5.7 10*3/uL (ref 3.4–10.8)

## 2019-07-01 LAB — COMPREHENSIVE METABOLIC PANEL
ALT: 14 IU/L (ref 0–32)
AST: 19 IU/L (ref 0–40)
Albumin/Globulin Ratio: 1.1 — ABNORMAL LOW (ref 1.2–2.2)
Albumin: 4.1 g/dL (ref 3.8–4.8)
Alkaline Phosphatase: 69 IU/L (ref 39–117)
BUN/Creatinine Ratio: 14 (ref 9–23)
BUN: 11 mg/dL (ref 6–24)
Bilirubin Total: <0.2 mg/dL (ref 0.0–1.2)
CO2: 23 mmol/L (ref 20–29)
Calcium: 9.2 mg/dL (ref 8.7–10.2)
Chloride: 99 mmol/L (ref 96–106)
Creatinine, Ser: 0.8 mg/dL (ref 0.57–1.00)
GFR calc Af Amer: 104 mL/min/{1.73_m2} (ref >59)
GFR calc non Af Amer: 90 mL/min/{1.73_m2} (ref >59)
Globulin, Total: 3.8 g/dL (ref 1.5–4.5)
Glucose: 109 mg/dL — ABNORMAL HIGH (ref 65–99)
Potassium: 4.5 mmol/L (ref 3.5–5.2)
Sodium: 139 mmol/L (ref 134–144)
Total Protein: 7.9 g/dL (ref 6.0–8.5)

## 2019-07-01 NOTE — Telephone Encounter (Signed)
Was on the phone for over 12 mins to try to add tests as below. Will try again later as I was unable to get in contact with a representative.

## 2019-07-01 NOTE — Telephone Encounter (Signed)
-----   Message from Virginia Crews, MD sent at 07/01/2019  8:17 AM EDT ----- Normal labs, except Hemoglobin is now low, reflecting anemia.  This is not cause of symptoms.  Will add iron panel (please call lab and add iron, ferritin, iron saturation, and TIBC) as that is the most likely cause of anemia  WBC is normal, despite infectious symptoms.  Please also see CXR results

## 2019-07-01 NOTE — Telephone Encounter (Signed)
-----   Message from Virginia Crews, MD sent at 07/01/2019  8:15 AM EDT ----- Normal chest XRay

## 2019-07-02 ENCOUNTER — Encounter: Payer: Self-pay | Admitting: Family Medicine

## 2019-07-02 ENCOUNTER — Telehealth: Payer: Self-pay

## 2019-07-02 ENCOUNTER — Encounter (INDEPENDENT_AMBULATORY_CARE_PROVIDER_SITE_OTHER): Payer: Self-pay

## 2019-07-02 MED ORDER — DOXYCYCLINE HYCLATE 100 MG PO TABS: 100.0000 mg | ORAL_TABLET | Freq: Two times a day (BID) | ORAL | 0 refills | Status: AC

## 2019-07-02 NOTE — Telephone Encounter (Signed)
Forms completed and ready to be faxed back

## 2019-07-02 NOTE — Telephone Encounter (Signed)
Far[place] pt's Case Manager w/[employer] calling checking on the status of the paper work for the patient. Reports that she has been sending it attn. Dr. Caryn Section and just wanted to make sure that we receive them. I confirmed that we got them and and that Dr.B is working on them.

## 2019-07-03 ENCOUNTER — Encounter (INDEPENDENT_AMBULATORY_CARE_PROVIDER_SITE_OTHER): Payer: Self-pay

## 2019-07-04 ENCOUNTER — Encounter (INDEPENDENT_AMBULATORY_CARE_PROVIDER_SITE_OTHER): Payer: Self-pay

## 2019-07-05 ENCOUNTER — Encounter (INDEPENDENT_AMBULATORY_CARE_PROVIDER_SITE_OTHER): Payer: Self-pay

## 2019-07-05 ENCOUNTER — Encounter: Payer: Self-pay | Admitting: Family Medicine

## 2019-07-05 NOTE — Telephone Encounter (Signed)
Form faxed and patient was advised.

## 2019-07-06 ENCOUNTER — Encounter (INDEPENDENT_AMBULATORY_CARE_PROVIDER_SITE_OTHER): Payer: Self-pay

## 2019-07-06 ENCOUNTER — Encounter: Payer: Self-pay | Admitting: Family Medicine

## 2019-07-06 NOTE — Telephone Encounter (Signed)
Well.. not sure what happened there and why we didn't get it added on earlier.  We can check these at next in person visit. Go ahead and let patient know. Thanks!

## 2019-07-06 NOTE — Telephone Encounter (Signed)
Patient advised. She has a appointment scheduled for 07/08/2019.

## 2019-07-08 ENCOUNTER — Encounter: Payer: Self-pay | Admitting: Family Medicine

## 2019-07-08 ENCOUNTER — Other Ambulatory Visit: Payer: Self-pay

## 2019-07-08 ENCOUNTER — Ambulatory Visit (INDEPENDENT_AMBULATORY_CARE_PROVIDER_SITE_OTHER): Payer: Managed Care, Other (non HMO) | Admitting: Family Medicine

## 2019-07-08 VITALS — BP 138/92 | HR 79 | Temp 98.1°F | Wt 195.6 lb

## 2019-07-08 DIAGNOSIS — R509 Fever, unspecified: Secondary | ICD-10-CM

## 2019-07-08 DIAGNOSIS — I10 Essential (primary) hypertension: Secondary | ICD-10-CM | POA: Diagnosis not present

## 2019-07-08 MED ORDER — AMLODIPINE BESYLATE 2.5 MG PO TABS: 2.5000 mg | ORAL_TABLET | Freq: Every day | ORAL | 1 refills | Status: DC

## 2019-07-08 NOTE — Assessment & Plan Note (Signed)
Discussed importance of healthy weight management Discussed diet and exercise  

## 2019-07-08 NOTE — Telephone Encounter (Signed)
Records faxed to Dr. Brita Romp per patient request. Aileen Fass, RN

## 2019-07-08 NOTE — Progress Notes (Signed)
Patient: Martha Mendoza Female    DOB: 04/18/1976   43 y.o.   MRN: [id number] Visit Date: 07/08/2019  Today's Provider: Lavon Paganini, MD   Chief Complaint  Patient presents with  . Hypertension   Subjective:    HPI  Hypertension, follow-up:  BP Readings from Last 3 Encounters:  07/08/19 (!) 138/92  06/23/19 (!) 142/81  06/08/19 (!) 152/92    She was last seen for hypertension 2 weeks ago.  BP at that visit was 142/81. Management changes since that visit include added amlodipine 2.5mg  daily. Stopped HCTZ while sick for last several weeks She reports good compliance with treatment. She is not having side effects.  She is exercising. She is not adherent to low salt diet.   Outside blood pressures are not checked. She is experiencing fatigue.  Patient denies chest pain, chest pressure/discomfort, exertional chest pressure/discomfort, irregular heart beat and lower extremity edema.   Cardiovascular risk factors include hypertension.  Use of agents associated with hypertension: none.     Weight trend: decreasing steadily Wt Readings from Last 3 Encounters:  07/08/19 195 lb 9.6 oz (88.7 kg)  06/08/19 203 lb (92.1 kg)  12/03/18 198 lb (89.8 kg)    Current diet: eat a lot of fast food lately per pt.  ------------------------------------------------------------------------  Fevers have now subsided for the past 4 to 5 days.  She was tested for COVID-19 and was negative x2.  We did check CMP and CBC and chest x-ray which were all unremarkable.  Though she had no known tick exposure or bites, we did start her on doxycycline to cover any possible pathogens.  She is feeling much better and all symptoms have resolved.  Allergies  Allergen Reactions  . Lisinopril Swelling    angioedema     Current Outpatient Medications:  .  amLODipine (NORVASC) 2.5 MG tablet, Take 1 tablet (2.5 mg total) by mouth daily., Disp: 30 tablet, Rfl: 1 .  fluticasone (FLONASE) 50 MCG/ACT  nasal spray, Place into both nostrils daily., Disp: , Rfl:  .  hydrochlorothiazide (HYDRODIURIL) 12.5 MG tablet, Take 1 tablet by mouth once daily, Disp: 90 tablet, Rfl: 1 .  valACYclovir (VALTREX) 1000 MG tablet, Take 1,000 mg by mouth 2 (two) times daily as needed., Disp: , Rfl:  .  doxycycline (VIBRA-TABS) 100 MG tablet, Take 1 tablet (100 mg total) by mouth 2 (two) times daily for 7 days. (Patient not taking: Reported on 07/08/2019), Disp: 14 tablet, Rfl: 0  Review of Systems  Constitutional: Negative.   Respiratory: Negative.   Genitourinary: Negative.   Neurological: Negative.     Social History   Tobacco Use  . Smoking status: Never Smoker  . Smokeless tobacco: Never Used  Substance Use Topics  . Alcohol use: No      Objective:   BP (!) 138/92 (BP Location: Left Arm, Patient Position: Sitting, Cuff Size: Normal)   Pulse 79   Temp 98.1 F (36.7 C) (Oral)   Wt 195 lb 9.6 oz (88.7 kg)   LMP 06/23/2019   BMI 36.96 kg/m  Vitals:   07/08/19 1401 07/08/19 1428  BP: (!) 139/94 (!) 138/92  Pulse: 79   Temp: 98.1 F (36.7 C)   TempSrc: Oral   Weight: 195 lb 9.6 oz (88.7 kg)      Physical Exam Vitals signs reviewed.  Constitutional:      General: She is not in acute distress.    Appearance: Normal appearance. She is well-developed. She is not  diaphoretic.  HENT:     Head: Normocephalic and atraumatic.     Right Ear: External ear normal.     Left Ear: External ear normal.  Eyes:     General: No scleral icterus.    Conjunctiva/sclera: Conjunctivae normal.  Neck:     Musculoskeletal: Neck supple.     Thyroid: No thyromegaly.  Cardiovascular:     Rate and Rhythm: Normal rate and regular rhythm.     Pulses: Normal pulses.     Heart sounds: Normal heart sounds. No murmur.  Pulmonary:     Effort: Pulmonary effort is normal. No respiratory distress.     Breath sounds: Normal breath sounds. No wheezing, rhonchi or rales.  Abdominal:     General: There is no  distension.     Palpations: Abdomen is soft.     Tenderness: There is no abdominal tenderness.  Musculoskeletal:     Right lower leg: No edema.     Left lower leg: No edema.  Lymphadenopathy:     Cervical: No cervical adenopathy.  Skin:    General: Skin is warm and dry.     Capillary Refill: Capillary refill takes less than 2 seconds.     Findings: No rash.  Neurological:     Mental Status: She is alert and oriented to person, place, and time. Mental status is at baseline.  Psychiatric:        Mood and Affect: Mood normal.        Behavior: Behavior normal.      No results found for any visits on 07/08/19.     Assessment & Plan   Problem List Items Addressed This Visit      Cardiovascular and Mediastinum   Benign essential hypertension - Primary    Uncontrolled, but patient has not been taking her medications regularly while she was sick as she felt like she may get dehydrated from her HCTZ We will continue HCTZ 12.5 mg daily and amlodipine 2.5 mg daily Advised to check home blood pressures Reviewed recent labs with normal creatinine Patient did not tolerate HCTZ 25 mg daily due to dehydration in the past Follow-up at CPE      Relevant Medications   amLODipine (NORVASC) 2.5 MG tablet     Other   Morbid obesity ([place])    Discussed importance of healthy weight management Discussed diet and exercise        Other Visit Diagnoses    Fever, unspecified fever cause        -Now resolved - Etiology still remains unclear as all testing was unremarkable -Discussed with patient that it is unclear if the doxycycline helped or if this is merely coincidental enough time had passed -All symptoms have resolved, so she can finish her doxycycline course and return to work -Discussed return precautions and what to do if symptoms recur   Return in about 2 months (around 09/08/2019) for CPE.   The entirety of the information documented in the History of Present Illness, Review of  Systems and Physical Exam were personally obtained by me. Portions of this information were initially documented by Martha Mendoza, CMA and reviewed by me for thoroughness and accuracy.    Martha Mendoza, Martha Bucy, MD MPH [facility]

## 2019-07-08 NOTE — Assessment & Plan Note (Signed)
Uncontrolled, but patient has not been taking her medications regularly while she was sick as she felt like she may get dehydrated from her HCTZ We will continue HCTZ 12.5 mg daily and amlodipine 2.5 mg daily Advised to check home blood pressures Reviewed recent labs with normal creatinine Patient did not tolerate HCTZ 25 mg daily due to dehydration in the past Follow-up at CPE

## 2019-07-19 ENCOUNTER — Encounter: Payer: Self-pay | Admitting: Family Medicine

## 2019-07-26 ENCOUNTER — Telehealth: Payer: Self-pay | Admitting: Family Medicine

## 2019-07-26 ENCOUNTER — Encounter: Payer: Managed Care, Other (non HMO) | Admitting: Family Medicine

## 2019-07-26 ENCOUNTER — Ambulatory Visit (INDEPENDENT_AMBULATORY_CARE_PROVIDER_SITE_OTHER): Payer: Managed Care, Other (non HMO) | Admitting: Family Medicine

## 2019-07-26 ENCOUNTER — Encounter (INDEPENDENT_AMBULATORY_CARE_PROVIDER_SITE_OTHER): Payer: Self-pay

## 2019-07-26 DIAGNOSIS — R509 Fever, unspecified: Secondary | ICD-10-CM

## 2019-07-26 DIAGNOSIS — R05 Cough: Secondary | ICD-10-CM | POA: Diagnosis not present

## 2019-07-26 NOTE — Telephone Encounter (Signed)
Pt has been running a low grade since 20th of June - around 99. and 101. Pt was put on doxycycline helped but came back.  Not feeling that well today and wanting to discuss.  Please advise.  Thanks, [employer]

## 2019-07-26 NOTE — Telephone Encounter (Signed)
Patient appointment was schedule for 1:30 PM.

## 2019-07-26 NOTE — Telephone Encounter (Signed)
Does anyone have any openings for evisits today? If not, you can put her on my schedule double booked at 120 for evisit and tell her to get on like 10 minutes early

## 2019-07-26 NOTE — Progress Notes (Signed)
Patient: Martha Mendoza Female    DOB: 24-Nov-1976   43 y.o.   MRN: [id number] Visit Date: 07/29/2019  Today's Provider: Lavon Paganini, MD   Chief Complaint  Patient presents with  . Fever   Subjective:    I, Porsha McClurkin CMA, am acting as a scribe for Lavon Paganini, MD.   Virtual Visit via Video Note  I connected with Martha Mendoza on 07/29/19 at  4:00 PM EDT by a video enabled telemedicine application and verified that I am speaking with the correct person using two identifiers.   Patient location: home Provider location: [place] involved in the visit: patient, provider   I discussed the limitations of evaluation and management by telemedicine and the availability of in person appointments. The patient expressed understanding and agreed to proceed.   Interactive audio and video communications were attempted, although failed due to patient's inability to connect to video. Continued visit with audio only interaction with patient agreement.   URI  The current episode started more than 1 month ago. The problem has been unchanged. The maximum temperature recorded prior to her arrival was 101 - 101.9 F (99.6). Associated symptoms include coughing. Pertinent negatives include no congestion or wheezing. She has tried acetaminophen for the symptoms. The treatment provided mild relief.    Patient states that cough started on Thursday 07/22/2019 but she have had the fever on/ off since mid June, 2020. Patient reports that he BP medication was changed around the sametime she started having the fever but did not know if that was the cause of the fever.  Last 2 wks, started to have higher temperature in the evenings.  For the last week, temperature has spiked to >101.  Was feeling well until today.  Today felt dizzy and fatigued.  Sweating a lot for the last 2 days.  Allergies  Allergen Reactions  . Lisinopril Swelling    angioedema     Current  Outpatient Medications:  .  amLODipine (NORVASC) 2.5 MG tablet, Take 1 tablet (2.5 mg total) by mouth daily., Disp: 30 tablet, Rfl: 1 .  fluticasone (FLONASE) 50 MCG/ACT nasal spray, Place into both nostrils daily., Disp: , Rfl:  .  hydrochlorothiazide (HYDRODIURIL) 12.5 MG tablet, Take 1 tablet by mouth once daily (Patient not taking: Reported on 07/26/2019), Disp: 90 tablet, Rfl: 1 .  valACYclovir (VALTREX) 1000 MG tablet, Take 1,000 mg by mouth 2 (two) times daily as needed., Disp: , Rfl:   Review of Systems  Constitutional: Positive for fever (99.6 or higher).  HENT: Negative for congestion.   Respiratory: Positive for cough. Negative for chest tightness, shortness of breath and wheezing.     Social History   Tobacco Use  . Smoking status: Never Smoker  . Smokeless tobacco: Never Used  Substance Use Topics  . Alcohol use: No      Objective:   There were no vitals taken for this visit. There were no vitals filed for this visit.   Physical Exam Does not sound short of breath. Speaks in full snetences  Results for orders placed or performed in visit on 07/26/19  Urine Culture   Specimen: Urine   UC  Result Value Ref Range   Urine Culture, Routine Final report    Organism ID, Bacteria No growth   Culture, blood (single)   Specimen: Blood   LM  Result Value Ref Range   BLOOD CULTURE, ROUTINE Preliminary report    Organism ID, Bacteria  Comment   Culture, blood (single)   Specimen: Blood   RA  Result Value Ref Range   BLOOD CULTURE, ROUTINE Preliminary report    Organism ID, Bacteria Comment   Lactate Dehydrogenase (LDH)  Result Value Ref Range   LDH 227 (H) 119 - 226 IU/L  C-reactive protein  Result Value Ref Range   CRP 19 (H) 0 - 10 mg/L  Sed Rate (ESR)  Result Value Ref Range   Sed Rate 67 (H) 0 - 32 mm/hr  Ferritin  Result Value Ref Range   Ferritin 50 15 - 150 ng/mL  CBC w/Diff/Platelet  Result Value Ref Range   WBC 4.1 3.4 - 10.8 x10E3/uL   RBC 5.52  (H) 3.77 - 5.28 x10E6/uL   Hemoglobin 10.2 (L) 11.1 - 15.9 g/dL   Hematocrit 33.9 (L) 34.0 - 46.6 %   MCV 61 (L) 79 - 97 fL   MCH 18.5 (L) 26.6 - 33.0 pg   MCHC 30.1 (L) 31.5 - 35.7 g/dL   RDW 15.3 11.7 - 15.4 %   Platelets 372 150 - 450 x10E3/uL   Neutrophils 52 Not Estab. %   Lymphs 35 Not Estab. %   Monocytes 10 Not Estab. %   Eos 1 Not Estab. %   Basos 1 Not Estab. %   Neutrophils Absolute 2.2 1.4 - 7.0 x10E3/uL   Lymphocytes Absolute 1.5 0.7 - 3.1 x10E3/uL   Monocytes Absolute 0.4 0.1 - 0.9 x10E3/uL   EOS (ABSOLUTE) 0.1 0.0 - 0.4 x10E3/uL   Basophils Absolute 0.0 0.0 - 0.2 x10E3/uL   Immature Granulocytes 1 Not Estab. %   Immature Grans (Abs) 0.0 0.0 - 0.1 x10E3/uL  Comprehensive metabolic panel  Result Value Ref Range   Glucose 99 65 - 99 mg/dL   BUN 6 6 - 24 mg/dL   Creatinine, Ser 0.88 0.57 - 1.00 mg/dL   GFR calc non Af Amer 80 >59 mL/min/1.73   GFR calc Af Amer 92 >59 mL/min/1.73   BUN/Creatinine Ratio 7 (L) 9 - 23   Sodium 136 134 - 144 mmol/L   Potassium 4.2 3.5 - 5.2 mmol/L   Chloride 98 96 - 106 mmol/L   CO2 23 20 - 29 mmol/L   Calcium 9.6 8.7 - 10.2 mg/dL   Total Protein 7.6 6.0 - 8.5 g/dL   Albumin 4.0 3.8 - 4.8 g/dL   Globulin, Total 3.6 1.5 - 4.5 g/dL   Albumin/Globulin Ratio 1.1 (L) 1.2 - 2.2   Bilirubin Total 0.3 0.0 - 1.2 mg/dL   Alkaline Phosphatase 77 39 - 117 IU/L   AST 13 0 - 40 IU/L   ALT 9 0 - 32 IU/L  QuantiFERON-TB Gold Plus  Result Value Ref Range   QuantiFERON Incubation WILL FOLLOW    QuantiFERON Criteria WILL FOLLOW    QuantiFERON TB1 Ag Value WILL FOLLOW    QuantiFERON TB2 Ag Value WILL FOLLOW    QuantiFERON Nil Value WILL FOLLOW    QuantiFERON Mitogen Value WILL FOLLOW    QuantiFERON-TB Gold Plus WILL FOLLOW   HIV antibody (with reflex)  Result Value Ref Range   HIV Screen 4th Generation wRfx Non Reactive Non Reactive  ANA  Result Value Ref Range   ANA Titer 1 Negative    Please Note: Comment   Urinalysis  Result Value Ref  Range   Specific Gravity, UA      >=1.030 (A) 1.005 - 1.030   pH, UA 5.5 5.0 - 7.5   Color, UA Yellow  Yellow   Appearance Ur Cloudy (A) Clear   Leukocytes,UA Negative Negative   Protein,UA 2+ (A) Negative/Trace   Glucose, UA Negative Negative   Ketones, UA Negative Negative   RBC, UA Negative Negative   Bilirubin, UA Negative Negative   Urobilinogen, Ur 1.0 0.2 - 1.0 mg/dL   Nitrite, UA Negative Negative  TSH  Result Value Ref Range   TSH 1.350 0.450 - 4.500 uIU/mL  Procalcitonin  Result Value Ref Range   Procalcitonin 0.03 0.00 - 0.08 ng/mL       Assessment & Plan    I discussed the assessment and treatment plan with the patient. The patient was provided an opportunity to ask questions and all were answered. The patient agreed with the plan and demonstrated an understanding of the instructions.   The patient was advised to call back or seek an in-person evaluation if the symptoms worsen or if the condition fails to improve as anticipated.   1. Persistent fever 2. Cough -Patient with fairly persistent daily fevers for almost 6 weeks -She has now tested negative for COVID twice - Given that she does have a new cough, though it is likely allergies as she states, it is reasonable to check her again for COVID -We will also ask her out of work, but if her COVID test is negative, it is reasonable to send her back - Discussed case with ID physician Dr.Ravishankar we will proceed with fever of unknown origin work-up -Labs ordered include LDH, CRP, ESR, ferritin, CBC, procalcitonin, CMP, quant gold, HIV, ANA, peripheral smear, blood cultures x2, UA, urine culture -Differential is broad, but bacterial infection seems unlikely given previous work-up with normal CBC, chest x-ray, UA - There was suspicion for tickborne illness previously she did improve with doxycycline, but it does not make sense of history of recurrent and that she would have normal LFTs during this process -Differential  also includes autoimmune disease, so it is possible she may need to see rheumatology in the future -Discussed symptomatic management with patient and continuing to monitor temperature as well as heart rate daily   - Lactate Dehydrogenase (LDH) - C-reactive protein - Sed Rate (ESR) - Ferritin - CBC w/Diff/Platelet - Procalcitonin - Comprehensive metabolic panel - QuantiFERON-TB Gold Plus - HIV antibody (with reflex) - ANA - Pathologist smear review - Urinalysis - Urine Culture - TSH - Temperature monitoring; Future - Culture, blood (single) - Culture, blood (single) - Temperature monitoring; Future    Return if symptoms worsen or fail to improve.   The entirety of the information documented in the History of Present Illness, Review of Systems and Physical Exam were personally obtained by me. Portions of this information were initially documented by [facility], CMA and reviewed by me for thoroughness and accuracy.    Bacigalupo, Dionne Bucy, MD MPH [facility]

## 2019-07-26 NOTE — Progress Notes (Signed)
This encounter was created in error - please disregard.

## 2019-07-27 ENCOUNTER — Encounter (INDEPENDENT_AMBULATORY_CARE_PROVIDER_SITE_OTHER): Payer: Self-pay

## 2019-07-27 ENCOUNTER — Other Ambulatory Visit: Payer: Self-pay

## 2019-07-27 DIAGNOSIS — Z20822 Contact with and (suspected) exposure to covid-19: Secondary | ICD-10-CM

## 2019-07-28 ENCOUNTER — Encounter (INDEPENDENT_AMBULATORY_CARE_PROVIDER_SITE_OTHER): Payer: Self-pay

## 2019-07-28 LAB — NOVEL CORONAVIRUS, NAA: SARS-CoV-2, NAA: NOT DETECTED

## 2019-07-29 ENCOUNTER — Encounter: Payer: Self-pay | Admitting: Family Medicine

## 2019-07-29 ENCOUNTER — Encounter (INDEPENDENT_AMBULATORY_CARE_PROVIDER_SITE_OTHER): Payer: Self-pay

## 2019-07-29 LAB — URINE CULTURE: Organism ID, Bacteria: NO GROWTH

## 2019-07-30 ENCOUNTER — Encounter (INDEPENDENT_AMBULATORY_CARE_PROVIDER_SITE_OTHER): Payer: Self-pay

## 2019-07-30 LAB — CBC WITH DIFFERENTIAL/PLATELET
Basophils Absolute: 0 10*3/uL (ref 0.0–0.2)
Basos: 1 %
EOS (ABSOLUTE): 0.1 10*3/uL (ref 0.0–0.4)
Eos: 1 %
Hematocrit: 33.9 % — ABNORMAL LOW (ref 34.0–46.6)
Hemoglobin: 10.2 g/dL — ABNORMAL LOW (ref 11.1–15.9)
Immature Grans (Abs): 0 10*3/uL (ref 0.0–0.1)
Immature Granulocytes: 1 %
Lymphocytes Absolute: 1.5 10*3/uL (ref 0.7–3.1)
Lymphs: 35 %
MCH: 18.5 pg — ABNORMAL LOW (ref 26.6–33.0)
MCHC: 30.1 g/dL — ABNORMAL LOW (ref 31.5–35.7)
MCV: 61 fL — ABNORMAL LOW (ref 79–97)
Monocytes Absolute: 0.4 10*3/uL (ref 0.1–0.9)
Monocytes: 10 %
Neutrophils Absolute: 2.2 10*3/uL (ref 1.4–7.0)
Neutrophils: 52 %
Platelets: 372 10*3/uL (ref 150–450)
RBC: 5.52 x10E6/uL — ABNORMAL HIGH (ref 3.77–5.28)
RDW: 15.3 % (ref 11.7–15.4)
WBC: 4.1 10*3/uL (ref 3.4–10.8)

## 2019-07-30 LAB — HIV ANTIBODY (ROUTINE TESTING W REFLEX): HIV Screen 4th Generation wRfx: NONREACTIVE

## 2019-07-30 LAB — QUANTIFERON-TB GOLD PLUS
QuantiFERON Mitogen Value: >10 IU/mL
QuantiFERON Nil Value: 0.2 IU/mL
QuantiFERON TB1 Ag Value: 1.59 IU/mL
QuantiFERON TB2 Ag Value: 2.03 IU/mL
QuantiFERON-TB Gold Plus: POSITIVE — AB

## 2019-07-30 LAB — COMPREHENSIVE METABOLIC PANEL
ALT: 9 IU/L (ref 0–32)
AST: 13 IU/L (ref 0–40)
Albumin/Globulin Ratio: 1.1 — ABNORMAL LOW (ref 1.2–2.2)
Albumin: 4 g/dL (ref 3.8–4.8)
Alkaline Phosphatase: 77 IU/L (ref 39–117)
BUN/Creatinine Ratio: 7 — ABNORMAL LOW (ref 9–23)
BUN: 6 mg/dL (ref 6–24)
Bilirubin Total: 0.3 mg/dL (ref 0.0–1.2)
CO2: 23 mmol/L (ref 20–29)
Calcium: 9.6 mg/dL (ref 8.7–10.2)
Chloride: 98 mmol/L (ref 96–106)
Creatinine, Ser: 0.88 mg/dL (ref 0.57–1.00)
GFR calc Af Amer: 92 mL/min/{1.73_m2} (ref >59)
GFR calc non Af Amer: 80 mL/min/{1.73_m2} (ref >59)
Globulin, Total: 3.6 g/dL (ref 1.5–4.5)
Glucose: 99 mg/dL (ref 65–99)
Potassium: 4.2 mmol/L (ref 3.5–5.2)
Sodium: 136 mmol/L (ref 134–144)
Total Protein: 7.6 g/dL (ref 6.0–8.5)

## 2019-07-30 LAB — C-REACTIVE PROTEIN: CRP: 19 mg/L — ABNORMAL HIGH (ref 0–10)

## 2019-07-30 LAB — ANA: ANA Titer 1: NEGATIVE

## 2019-07-30 LAB — URINALYSIS
Bilirubin, UA: NEGATIVE
Glucose, UA: NEGATIVE
Ketones, UA: NEGATIVE
Leukocytes,UA: NEGATIVE
Nitrite, UA: NEGATIVE
RBC, UA: NEGATIVE
Specific Gravity, UA: >=1.03 — AB (ref 1.005–1.030)
Urobilinogen, Ur: 1 mg/dL (ref 0.2–1.0)
pH, UA: 5.5 (ref 5.0–7.5)

## 2019-07-30 LAB — TSH: TSH: 1.35 u[IU]/mL (ref 0.450–4.500)

## 2019-07-30 LAB — SEDIMENTATION RATE: Sed Rate: 67 mm/hr — ABNORMAL HIGH (ref 0–32)

## 2019-07-30 LAB — LACTATE DEHYDROGENASE: LDH: 227 IU/L — ABNORMAL HIGH (ref 119–226)

## 2019-07-30 LAB — PROCALCITONIN: Procalcitonin: 0.03 ng/mL (ref 0.00–0.08)

## 2019-07-30 LAB — FERRITIN: Ferritin: 50 ng/mL (ref 15–150)

## 2019-07-31 ENCOUNTER — Encounter (INDEPENDENT_AMBULATORY_CARE_PROVIDER_SITE_OTHER): Payer: Self-pay

## 2019-08-01 ENCOUNTER — Encounter (INDEPENDENT_AMBULATORY_CARE_PROVIDER_SITE_OTHER): Payer: Self-pay

## 2019-08-02 ENCOUNTER — Encounter: Payer: Self-pay | Admitting: Family Medicine

## 2019-08-02 ENCOUNTER — Encounter (INDEPENDENT_AMBULATORY_CARE_PROVIDER_SITE_OTHER): Payer: Self-pay

## 2019-08-02 LAB — CULTURE, BLOOD (SINGLE)

## 2019-08-03 ENCOUNTER — Telehealth: Payer: Self-pay

## 2019-08-03 ENCOUNTER — Encounter (INDEPENDENT_AMBULATORY_CARE_PROVIDER_SITE_OTHER): Payer: Self-pay

## 2019-08-03 DIAGNOSIS — R509 Fever, unspecified: Secondary | ICD-10-CM

## 2019-08-03 DIAGNOSIS — R7612 Nonspecific reaction to cell mediated immunity measurement of gamma interferon antigen response without active tuberculosis: Secondary | ICD-10-CM

## 2019-08-03 DIAGNOSIS — D509 Iron deficiency anemia, unspecified: Secondary | ICD-10-CM

## 2019-08-03 NOTE — Telephone Encounter (Signed)
[facility] 

## 2019-08-03 NOTE — Telephone Encounter (Signed)
Spoke with patient and she had seen her results/ message on MyChart. Patient states that she is fine with the referral been placed to Infectious Disease. Patient states that she has always tested positive for like 20 years and always had to due a chest x-ray because they told her that she will always test positive for TB. Patient states that she will be back in to have labs drawn.

## 2019-08-03 NOTE — Telephone Encounter (Signed)
Pt returned missed call.  Please call pt back. ° °Thanks, °[facility] °

## 2019-08-03 NOTE — Telephone Encounter (Signed)
-----   Message from Virginia Crews, MD sent at 08/03/2019  8:17 AM EDT ----- Normal labs, except inflammatory markers are elevated, patient is anemic (will check CBC, iron panel, B12, haptoglobin, reticulocytes in 2 weeks), and quantiferon gold remains positive. No signs of a bacterial infection.  We are awaiting a callback from the health department about what was latent TB.  Symptoms currently could represent activation of TB.  I would like to place referral to Infectious Disease to have this worked up further.

## 2019-08-04 ENCOUNTER — Encounter (INDEPENDENT_AMBULATORY_CARE_PROVIDER_SITE_OTHER): Payer: Self-pay

## 2019-08-04 NOTE — Telephone Encounter (Signed)
Patient was advised and states that she understands now and had did some research. Patient states that she will wait for infectious disease to contact her.FYI

## 2019-08-04 NOTE — Telephone Encounter (Signed)
Labs ordered and can be left up front.  ID referral ordered.  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  This will require further workup.

## 2019-08-05 ENCOUNTER — Encounter (INDEPENDENT_AMBULATORY_CARE_PROVIDER_SITE_OTHER): Payer: Self-pay

## 2019-08-09 ENCOUNTER — Telehealth: Payer: Self-pay | Admitting: Licensed Clinical Social Worker

## 2019-08-09 NOTE — Telephone Encounter (Signed)
Patient is scheduled for 8/20 at 12 noon

## 2019-08-09 NOTE — Telephone Encounter (Signed)
Received a referral from [facility] to see the patient. I called and left the patient a message to call for an appointment.

## 2019-08-12 ENCOUNTER — Other Ambulatory Visit: Payer: Self-pay

## 2019-08-12 ENCOUNTER — Encounter: Payer: Self-pay | Admitting: Infectious Diseases

## 2019-08-12 ENCOUNTER — Ambulatory Visit: Payer: Managed Care, Other (non HMO) | Attending: Infectious Diseases | Admitting: Infectious Diseases

## 2019-08-12 VITALS — BP 134/85 | HR 88 | Temp 98.7°F | Wt 199.5 lb

## 2019-08-12 DIAGNOSIS — D509 Iron deficiency anemia, unspecified: Secondary | ICD-10-CM | POA: Diagnosis not present

## 2019-08-12 DIAGNOSIS — I10 Essential (primary) hypertension: Secondary | ICD-10-CM

## 2019-08-12 DIAGNOSIS — G44229 Chronic tension-type headache, not intractable: Secondary | ICD-10-CM

## 2019-08-12 DIAGNOSIS — Z79899 Other long term (current) drug therapy: Secondary | ICD-10-CM

## 2019-08-12 DIAGNOSIS — R7612 Nonspecific reaction to cell mediated immunity measurement of gamma interferon antigen response without active tuberculosis: Secondary | ICD-10-CM

## 2019-08-12 DIAGNOSIS — R509 Fever, unspecified: Secondary | ICD-10-CM | POA: Diagnosis not present

## 2019-08-12 DIAGNOSIS — Z888 Allergy status to other drugs, medicaments and biological substances status: Secondary | ICD-10-CM

## 2019-08-12 NOTE — Progress Notes (Signed)
NAME: Martha Mendoza  DOB: 1976-11-17  MRN: [id number]  Date/Time: 08/12/2019 12:39 PM  REQUESTING PROVIDER: Dr. Brita Romp Subjective:  REASON FOR CONSULT: Fever and positive QuantiFERON gold. ?History from patient and chart reviewed completely. Martha Mendoza is a 43 y.o. female with a history of hypertension, positive PPD since 1998 and iron deficiency anemia is referred to me because of positive QuantiFERON gold and fever of 6 weeks duration. 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  June 08, 2019 she went to see her PCP because she was having headaches and her blood pressure was on the higher side.  In his office the temperature was noted to be 99.7.  Since then she started checking her temperature as she was having night sweats and also feeling very cold and chilly at nighttime.  She says the temperature ranged from 99 point 3 in the morning to 10 1 at night.  She stayed out of work from June 17 until June 28 and then again from July 1 to July 17.  As she continued to have fever she went back to see her PCP on 06/23/2019 and in her office her temperature was 101.5.  COVID 19 was suspected and test was done.  And also she was diagnosed with chronic tension type headaches and  fever of unspecified cause.  COVID-19 test was sent.  She also started her on amlodipine 2.5 mg for uncontrolled blood pressure.Patient was already taking hydrochlorothiazide.  SARS-CoV-2 test came back negative.  Patient also had a papilloma pustular eruption on her chest and face first week of July and was told it could be acneform eruption or heat rash.      Patient was prescribed doxycycline for 7 days between 07/02/2019 until 07/09/2019 for persistent fever and to cover any tickborne illness. She also had a cough which was minimally productive.There was no wheezing or shortness of breath.  She took over-the-counter medications and got better.  On August 4 PCP talk to me and we sent off a few other tests including blood culture,  ESR, QuantiFERON gold, ANA, LDH.  ESR was 64, QuantiFERON gold was positive which was also +3 months ago.  ANA was negative and LDH was higher range of normal 229. Chest x-ray of the patient was normal On further discussion patient is known to have positive PPD since 1998 when she had worked at [employer].  She had not taken any treatment then.  In May for her job she had another QuantiFERON gold done which was positive.  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  Patient says since August 06, 2019 her fevers have subsided completely.  She is feeling much better.  She still has a little night sweats but is more on her forehead.  Fatigue and lack of energy is also improved.  She has gone back to work from July 30, 2019.[employer] She also had a dental procedure cleaning done on August 02, 2019.  Appetite is fine.  She has lost about like 8 pounds.  No abdominal symptoms, no urinary symptoms, or joint pain.  No history of recent procedure , surgery ,  vaccinations ,trauma , sick contacts , steroid use, animal bites or tick bites. She lives with her 53 year old daughter and 54 year old son. She has no pets at home No travel history No family history of autoimmune disorder Past Medical History:  Diagnosis Date  . Fever blister   . Hypertension   . Iron  deficiency anemia   . Vitamin D deficiency     Past Surgical History:  Procedure Laterality Date  . BREAST BIOPSY Right 11/28/2015   FIBROADENOMA WITH USUAL DUCTAL HYPERPLASIA  . BREAST EXCISIONAL BIOPSY Left 1999   benign  . ROOT CANAL    . WISDOM TOOTH EXTRACTION  01/2017   x4  C-section   Social history Non-smoker, no alcohol, no drug use not sexually active in 1 year  Family History  Problem Relation Age of Onset  . Breast cancer Mother 56       metastatic  . Stroke Mother 89  . Hypertension Mother   . Diabetes Mother   . Breast cancer Maternal Aunt        unknown age  . Healthy  Father   . Hypertension Sister   . Cancer Sister   . Hypertension Brother   . Healthy Brother   . Prostate cancer Maternal Uncle   . Colon cancer Neg Hx    Allergies  Allergen Reactions  . Lisinopril Swelling    angioedema   ? Current Outpatient Medications  Medication Sig Dispense Refill  . amLODipine (NORVASC) 2.5 MG tablet Take 1 tablet (2.5 mg total) by mouth daily. 30 tablet 1  . fluticasone (FLONASE) 50 MCG/ACT nasal spray Place into both nostrils daily.    . hydrochlorothiazide (HYDRODIURIL) 12.5 MG tablet Take 1 tablet by mouth once daily (Patient not taking: Reported on 07/26/2019) 90 tablet 1  . valACYclovir (VALTREX) 1000 MG tablet Take 1,000 mg by mouth 2 (two) times daily as needed.     No current facility-administered medications for this visit.      Abtx:  Anti-infectives (From admission, onward)   None      REVIEW OF SYSTEMS:  Const: negative fever, negative chills, negative weight loss Eyes: negative diplopia or visual changes, negative eye pain ENT: negative coryza, negative sore throat Resp: negative cough, hemoptysis, dyspnea Cards: negative for chest pain, palpitations, lower extremity edema GU: negative for frequency, dysuria and hematuria GI: Negative for abdominal pain, diarrhea, bleeding, constipation Skin: negative for rash and pruritus Heme: negative for easy bruising and gum/nose bleeding MS: negative for myalgias, arthralgias, back pain and muscle weakness Neurolo:negative for headaches, dizziness, vertigo, memory problems  Psych: negative for feelings of anxiety, depression  Endocrine: negative for thyroid, diabetes Allergy/Immunology- negative for any medication or food allergies ? Pertinent Positives include : Objective:  VITALS:  BP 134/85 (BP Location: Left Arm, Patient Position: Sitting, Cuff Size: Large)   Pulse 88   Temp 98.7 F (37.1 C) (Oral)   Wt 199 lb 8 oz (90.5 kg)   BMI 37.70 kg/m  PHYSICAL EXAM:  General: Alert,  cooperative, no distress, appears stated age.  Head: Normocephalic, without obvious abnormality, atraumatic. Eyes: Conjunctivae clear, anicteric sclerae. Pupils are equal ENT Nares normal. No drainage or sinus tenderness. Lips, mucosa, and tongue normal. No Thrush Neck: Supple, symmetrical, no adenopathy, thyroid: non tender no carotid bruit and no JVD. Back: No CVA tenderness. Lungs: Clear to auscultation bilaterally. No Wheezing or Rhonchi. No rales. Heart: Regular rate and rhythm, no murmur, rub or gallop. Abdomen: Soft, non-tender,not distended. Bowel sounds normal. No masses Extremities: atraumatic, no cyanosis. No edema. No clubbing Skin: No rashes or lesions. Or bruising Lymph: Cervical, supraclavicular normal. Neurologic: Grossly non-focal Pertinent Labs Lab Results CBC    Component Value Date/Time   WBC 4.1 07/27/2019 0954   WBC 9.4 04/18/2018 1924   RBC 5.52 (H) 07/27/2019 0954   RBC  5.91 (H) 04/18/2018 1924   HGB 10.2 (L) 07/27/2019 0954   HCT 33.9 (L) 07/27/2019 0954   PLT 372 07/27/2019 0954   MCV 61 (L) 07/27/2019 0954   MCH 18.5 (L) 07/27/2019 0954   MCH 20.7 (L) 04/18/2018 1924   MCHC 30.1 (L) 07/27/2019 0954   MCHC 32.2 04/18/2018 1924   RDW 15.3 07/27/2019 0954   LYMPHSABS 1.5 07/27/2019 0954   EOSABS 0.1 07/27/2019 0954   BASOSABS 0.0 07/27/2019 0954    CMP Latest Ref Rng & Units 07/27/2019 06/30/2019 09/22/2018  Glucose 65 - 99 mg/dL 99 109(H) 113(H)  BUN 6 - 24 mg/dL _0 Creatinine 0.57 - 1.00 mg/dL 0.88 0.80 0.89  Sodium 134 - 144 mmol/L 136 139 140  Potassium 3.5 - 5.2 mmol/L 4.2 4.5 3.5  Chloride 96 - 106 mmol/L 98 99 102  CO2 20 - 29 mmol/L _1 Calcium 8.7 - 10.2 mg/dL 9.6 9.2 9.4  Total Protein 6.0 - 8.5 g/dL 7.6 7.9 7.0  Total Bilirubin 0.0 - 1.2 mg/dL 0.3 <0.2 0.4  Alkaline Phos 39 - 117 IU/L 77 69 60  AST 0 - 40 IU/L _2 ALT 0 - 32 IU/L _3 Microbiology: No results found for this or any previous visit (from the  past 240 hour(s)).  IMAGING RESULTS: I have personally reviewed the films ? Impression/Recommendation ? ? ? ___________________________________________________ Discussed with patient, requesting provider Note:  This document was prepared using Dragon voice recognition software and may include unintentional dictation errors. NAME: Martha Mendoza  DOB: 30-Jun-1976  MRN: [id number]  Date/Time: 08/12/2019 1:39 PM  REQUESTING PROVIDER Subjective:  REASON FOR CONSULT:  ? Martha Mendoza is a 76 y.o. with a history of Past Medical History:  Diagnosis Date  . Fever blister   . Hypertension   . Iron deficiency anemia   . Vitamin D deficiency     Past Surgical History:  Procedure Laterality Date  . BREAST BIOPSY Right 11/28/2015   FIBROADENOMA WITH USUAL DUCTAL HYPERPLASIA  . BREAST EXCISIONAL BIOPSY Left 1999   benign  . ROOT CANAL    . WISDOM TOOTH EXTRACTION  01/2017   x4    Social History   Socioeconomic History  . Marital status: Divorced    Spouse name: Not on file  . Number of children: 2  . Years of education: 76  . Highest education level: Bachelor's degree (e.g., BA, AB, BS)  Occupational History  . 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 Financial resource strain: Not hard at all  . Food insecurity    Worry: Never true    Inability: Never true  . Transportation needs    Medical: No    Non-medical: No  Tobacco Use  . Smoking status: Never Smoker  . Smokeless tobacco: Never Used  Substance and Sexual Activity  . Alcohol use: No  . Drug use: No  . Sexual activity: Not Currently    Partners: Male    Birth control/protection: None  Lifestyle  . Physical activity    Days per week: 0 days    Minutes per session: 0 min  . Stress: Not on file  Relationships  . Social Herbalist on phone: Not on file    Gets together: Not on file    Attends religious service: Not on file    Active  member of club  or organization: Not on file    Attends meetings of clubs or organizations: Not on file    Relationship status: Not on file  . Intimate partner violence    Fear of current or ex partner: Not on file    Emotionally abused: Not on file    Physically abused: Not on file    Forced sexual activity: Not on file  Other Topics Concern  . Not on file  Social History Narrative  . Not on file    Family History  Problem Relation Age of Onset  . Breast cancer Mother 60       metastatic  . Stroke Mother 63  . Hypertension Mother   . Diabetes Mother   . Breast cancer Maternal Aunt        unknown age  . Healthy Father   . Hypertension Sister   . Cancer Sister   . Hypertension Brother   . Healthy Brother   . Prostate cancer Maternal Uncle   . Colon cancer Neg Hx    Allergies  Allergen Reactions  . Lisinopril Swelling    angioedema   ID  Recent  Procedure Surgery Injections Trauma Sick contacts Travel Antibiotic use Food- raw/exotic Steroid/immune suppressants/splenectomy/Hardware Animal bites Tick exposure Water sports Fishing/hunting/animal bird exposure ? Current Outpatient Medications  Medication Sig Dispense Refill  . amLODipine (NORVASC) 2.5 MG tablet Take 1 tablet (2.5 mg total) by mouth daily. 30 tablet 1  . fluticasone (FLONASE) 50 MCG/ACT nasal spray Place into both nostrils daily.    . hydrochlorothiazide (HYDRODIURIL) 12.5 MG tablet Take 1 tablet by mouth once daily (Patient not taking: Reported on 07/26/2019) 90 tablet 1  . valACYclovir (VALTREX) 1000 MG tablet Take 1,000 mg by mouth 2 (two) times daily as needed.     No current facility-administered medications for this visit.      Abtx:  Anti-infectives (From admission, onward)   None      REVIEW OF SYSTEMS:  Const: Fever, chills, night sweats.  8 pound weight loss Eyes: negative diplopia or visual changes, negative eye pain ENT: negative coryza, negative sore throat Resp: Had cough, no hemoptysis,  dyspnea Cards: negative for chest pain, palpitations, lower extremity edema GU: negative for frequency, dysuria and hematuria GI: Negative for abdominal pain, diarrhea, bleeding, constipation Skin: Had a papulopustular eruption on the front of her chest which lasted for a few days in July. Heme: negative for easy bruising and gum/nose bleeding MS: negative for myalgias, arthralgias, back pain and muscle weakness, had fatigue Neurolo: Positive headaches, some dizziness, no vertigo, memory problems  Psych: negative for feelings of anxiety, depression  Endocrine: negative for thyroid, diabetes Allergy/Immunology-as above ? Objective:  VITALS:  BP 134/85 (BP Location: Left Arm, Patient Position: Sitting, Cuff Size: Large)   Pulse 88   Temp 98.7 F (37.1 C) (Oral)   Wt 199 lb 8 oz (90.5 kg)   BMI 37.70 kg/m  PHYSICAL EXAM:  General: Alert, cooperative, no distress, appears stated age.  Head: Normocephalic, without obvious abnormality, atraumatic. Eyes: Conjunctivae clear, anicteric sclerae. Pupils are equal ENT Nares normal. No drainage or sinus tenderness. Lips, mucosa, and tongue normal. No Thrush Neck: Supple, symmetrical, no adenopathy, thyroid: non tender no carotid bruit and no JVD. Back: No CVA tenderness. Lungs: Clear to auscultation bilaterally. No Wheezing or Rhonchi. No rales. Heart: Regular rate and rhythm, no murmur, rub or gallop. Abdomen: Soft, non-tender,not distended. Bowel sounds normal. No masses Extremities: atraumatic, no cyanosis. No  edema. No clubbing Skin: No rashes or lesions. Or bruising Lymph: Cervical, supraclavicular normal. Neurologic: Grossly non-focal Pertinent Labs Lab Results CBC    Component Value Date/Time   WBC 4.1 07/27/2019 0954   WBC 9.4 04/18/2018 1924   RBC 5.52 (H) 07/27/2019 0954   RBC 5.91 (H) 04/18/2018 1924   HGB 10.2 (L) 07/27/2019 0954   HCT 33.9 (L) 07/27/2019 0954   PLT 372 07/27/2019 0954   MCV 61 (L) 07/27/2019 0954    MCH 18.5 (L) 07/27/2019 0954   MCH 20.7 (L) 04/18/2018 1924   MCHC 30.1 (L) 07/27/2019 0954   MCHC 32.2 04/18/2018 1924   RDW 15.3 07/27/2019 0954   LYMPHSABS 1.5 07/27/2019 0954   EOSABS 0.1 07/27/2019 0954   BASOSABS 0.0 07/27/2019 0954    CMP Latest Ref Rng & Units 07/27/2019 06/30/2019 09/22/2018  Glucose 65 - 99 mg/dL 99 109(H) 113(H)  BUN 6 - 24 mg/dL _0 Creatinine 0.57 - 1.00 mg/dL 0.88 0.80 0.89  Sodium 134 - 144 mmol/L 136 139 140  Potassium 3.5 - 5.2 mmol/L 4.2 4.5 3.5  Chloride 96 - 106 mmol/L 98 99 102  CO2 20 - 29 mmol/L _1 Calcium 8.7 - 10.2 mg/dL 9.6 9.2 9.4  Total Protein 6.0 - 8.5 g/dL 7.6 7.9 7.0  Total Bilirubin 0.0 - 1.2 mg/dL 0.3 <0.2 0.4  Alkaline Phos 39 - 117 IU/L 77 69 60  AST 0 - 40 IU/L _2 ALT 0 - 32 IU/L _3 07/27/2019 HIV negative ANA negative ESR 64 Coronavirus negative Blood culture negative Urine culture negative  IMAGING RESULTS:  ? Impression/Recommendation 43 year old female with history of hypertension and iron deficiency anemia and chronic positive PPD presented with 6-week history of fever.   FUO   At first blush this  looks like a viral infection.  SARS-CoV-2 was a concern.  But she has negative PCR and recently had antibodies were tested and was negative.  The fever was not associated with abnormal liver function test or leukocytosis.  The ESR was elevated at67 and CRP was 19.  She has been afebrile since 08/06/2019 now for the last 1 week.   Because she had chronic positive PPD and a positive QuantiFERON gold recently tuberculosis was in the differential diagnosis.  She does not have any risk factors that would put her at risk for latent TB of 22 years becoming active TB.  She is not on steroids, diabetes mellitus, renal failure, immune modulating drugs etc. Chest x-ray is normal with no infiltrate. She does not seem to have  pulmonary TB currently. Other differential diagnosis for FUO is an infective  endocarditis. Blood culture from 07/27/2019 is negative.  Other differential diagnosis for fever includes noninfectious causes like autoimmune disorder, malignancy or drug-induced fever.  As currently patient has been afebrile for the past 1 week we will hold off on any further testing now.  I asked her to monitor her temperature twice a day off Tylenol for the next 2 weeks.  If she continues to have fever then we need to get CT abdomen and pelvis and chest, blood culture, echo, repeat ESR, ferritin, ACE level, ANCA, rheumatoid factor and he may have to pursue further diagnostic testing for tuberculosis.   Hypertension on hydrochlorothiazide and amlodipine  Microcytic anemia.  Has chronic iron deficiency anemia ___________________________________________________ Discussed with patient in great detail and communicated with the requesting provider. Spent around 60 minutes in this visit  We will see her in 3 weeks.  Note:  This document was prepared using Dragon voice recognition software and may include unintentional dictation errors.

## 2019-08-12 NOTE — Patient Instructions (Addendum)
You are here for fever of 6 weeks and a positive quantiferon gold- Your fever has resolved since 08/06/19. You have some night sweats especially around the forehead.you have had a positive PPD since 1998-cxr normal Today Your examination was normal. I cannot say for sure that you have active TB  now have I ruled it out- OF course your CXR is normal so pulmonary TB is not likely Please monitor your temperature off tylenol twice a day. We will talk again in 2 weeks- if the fever recurs will need Imaging Ct abdomen/pelvis/Chest  And few other blood tests including Blood culture , ESR, ANCA, ACE, CRP, Ferritin, Rh factor.

## 2019-09-01 LAB — CBC WITH DIFFERENTIAL/PLATELET
Basophils Absolute: 0 10*3/uL (ref 0.0–0.2)
Basos: 0 %
EOS (ABSOLUTE): 0.1 10*3/uL (ref 0.0–0.4)
Eos: 1 %
Hematocrit: 37.7 % (ref 34.0–46.6)
Hemoglobin: 11.1 g/dL (ref 11.1–15.9)
Immature Grans (Abs): 0 10*3/uL (ref 0.0–0.1)
Immature Granulocytes: 0 %
Lymphocytes Absolute: 2.4 10*3/uL (ref 0.7–3.1)
Lymphs: 38 %
MCH: 19.3 pg — ABNORMAL LOW (ref 26.6–33.0)
MCHC: 29.4 g/dL — ABNORMAL LOW (ref 31.5–35.7)
MCV: 66 fL — ABNORMAL LOW (ref 79–97)
Monocytes Absolute: 0.5 10*3/uL (ref 0.1–0.9)
Monocytes: 7 %
Neutrophils Absolute: 3.4 10*3/uL (ref 1.4–7.0)
Neutrophils: 54 %
Platelets: 395 10*3/uL (ref 150–450)
RBC: 5.75 x10E6/uL — ABNORMAL HIGH (ref 3.77–5.28)
RDW: 19.5 % — ABNORMAL HIGH (ref 11.7–15.4)
WBC: 6.4 10*3/uL (ref 3.4–10.8)

## 2019-09-01 LAB — IRON,TIBC AND FERRITIN PANEL
Ferritin: 69 ng/mL (ref 15–150)
Iron Saturation: 61 % — ABNORMAL HIGH (ref 15–55)
Iron: 183 ug/dL — ABNORMAL HIGH (ref 27–159)
Total Iron Binding Capacity: 302 ug/dL (ref 250–450)
UIBC: 119 ug/dL — ABNORMAL LOW (ref 131–425)

## 2019-09-01 LAB — RETICULOCYTES: Retic Ct Pct: 2.7 % — ABNORMAL HIGH (ref 0.6–2.6)

## 2019-09-01 LAB — HAPTOGLOBIN: Haptoglobin: 143 mg/dL (ref 42–296)

## 2019-09-01 LAB — VITAMIN B12: Vitamin B-12: 606 pg/mL (ref 232–1245)

## 2019-09-02 ENCOUNTER — Other Ambulatory Visit: Payer: Self-pay

## 2019-09-02 ENCOUNTER — Encounter: Payer: Self-pay | Admitting: Infectious Diseases

## 2019-09-02 ENCOUNTER — Ambulatory Visit: Payer: Managed Care, Other (non HMO) | Attending: Infectious Diseases | Admitting: Infectious Diseases

## 2019-09-02 ENCOUNTER — Other Ambulatory Visit
Admission: RE | Admit: 2019-09-02 | Discharge: 2019-09-02 | Disposition: A | Discharge status: Home / Self Care | Payer: Managed Care, Other (non HMO) | Source: Ambulatory Visit | Attending: Infectious Diseases | Admitting: Infectious Diseases

## 2019-09-02 VITALS — BP 118/80 | HR 71 | Temp 98.0°F | Ht 61.0 in

## 2019-09-02 DIAGNOSIS — D509 Iron deficiency anemia, unspecified: Secondary | ICD-10-CM | POA: Diagnosis not present

## 2019-09-02 DIAGNOSIS — R7612 Nonspecific reaction to cell mediated immunity measurement of gamma interferon antigen response without active tuberculosis: Secondary | ICD-10-CM | POA: Diagnosis not present

## 2019-09-02 DIAGNOSIS — R509 Fever, unspecified: Secondary | ICD-10-CM

## 2019-09-02 DIAGNOSIS — I10 Essential (primary) hypertension: Secondary | ICD-10-CM | POA: Diagnosis not present

## 2019-09-02 DIAGNOSIS — G44229 Chronic tension-type headache, not intractable: Secondary | ICD-10-CM

## 2019-09-02 DIAGNOSIS — Z888 Allergy status to other drugs, medicaments and biological substances status: Secondary | ICD-10-CM

## 2019-09-02 LAB — C-REACTIVE PROTEIN: CRP: <0.8 mg/dL (ref <1.0)

## 2019-09-02 LAB — SEDIMENTATION RATE: Sed Rate: 9 mm/hr (ref 0–20)

## 2019-09-02 NOTE — Patient Instructions (Addendum)
You are here for follow up of FUO- this has resolved since 08/06/19. You have a positive PPD and quantiferon gold and negative CXR- I dont think you have active TB as your symptoms like fever will not subside without treatment- we may treat for latent TB sometime in the futire- continue to monitor your temp for another 4 weeks- Will also see explore your question about assassin bug ( reduviid) that could cause chagas.

## 2019-09-02 NOTE — Progress Notes (Signed)
NAME: Martha Mendoza  DOB: September 15, 1976  MRN: [id number]  Date/Time: 09/02/2019 1:04 PM  REQUESTING PROVIDER: Dr. Brita Romp Subjective:  REASON FOR CONSULT: Fever and positive QuantiFERON gold.  Follow-up visit Was seen on 08/12/2023 FUO.  Since she saw me she has not had any fever.  She has kept a temperature log for the past 2 weeks and she has not had any fever and it is not gone beyond 99  .  She has no night sweats, cough or shortness of breath.  She does not have any sore throat.  No headache or body ache or rash or joint pains.  No diarrhea or dysuria or hematuria. She says in August she had found an insect on 1 of her plants in her balcony.  This was a garlic plant and she had eaten the leaf once.  She showed a picture of that insect to her friend who who identify it as a possible SSN [place].  Patient did some Musician and thought this was a reduviid that could cause Chagas disease.  She wanted know more about that.  She did not get any bites. ?  Her history is as follows. Martha Mendoza is a 43 y.o. female with a history of hypertension, positive PPD since 1998 and iron deficiency anemia was referred to me in August for positive QuantiFERON gold and fever of 6 weeks duration. 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  June 08, 2019 she went to see her PCP because she was having headaches and her blood pressure was on the higher side.  In his office the temperature was noted to be 99.7.  Since then she started checking her temperature as she was having night sweats and also feeling very cold and chilly at nighttime.  She says the temperature ranged from 99 point 3 in the morning to 10 1 at night.  She stayed out of work from June 17 until June 28 and then again from July 1 to July 17.  As she continued to have fever she went back to see her PCP on 06/23/2019 and in her office her temperature was 101.5.  COVID 19 was suspected and test was done.  And also she was diagnosed with chronic tension  type headaches and  fever of unspecified cause.  COVID-19 test was sent.  She also started her on amlodipine 2.5 mg for uncontrolled blood pressure.Patient was already taking hydrochlorothiazide.  SARS-CoV-2 test came back negative.  Patient also had a papilloma pustular eruption on her chest and face first week of July and was told it could be acneform eruption or heat rash.      Patient was prescribed doxycycline for 7 days between 07/02/2019 until 07/09/2019 for persistent fever and to cover any tickborne illness. Labs like blood culture, ESR, QuantiFERON gold, ANA, LDH were sent and the results were as follows.  ESR was 64, QuantiFERON gold was positive which was also +3 months ago.  ANA was negative and LDH was higher range of normal 229. Chest x-ray of the patient was normal.  CRP was 19. On further discussion patient is known to have positive PPD since 1998 when she had worked at [employer].  She had not taken any treatment then.  In May for her job she had another QuantiFERON gold done which was positive.  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  Past Medical History:  Diagnosis Date   Fever blister  Hypertension   °• Iron deficiency anemia   °• Vitamin D deficiency   °  °Past Surgical History:  °Procedure Laterality Date  °• BREAST BIOPSY Right 11/28/2015  ° FIBROADENOMA WITH USUAL DUCTAL HYPERPLASIA  °• BREAST EXCISIONAL BIOPSY Left 1999  ° benign  °• ROOT CANAL    °• WISDOM TOOTH EXTRACTION  01/2017  ° x4  °C-section ° ° °Social history °Non-smoker, no alcohol, no drug use not sexually active in 1 year  °Family History  °Problem Relation Age of Onset  °• Breast cancer Mother 50  °     metastatic  °• Stroke Mother 43  °• Hypertension Mother   °• Diabetes Mother   °• Breast cancer Maternal Aunt   °     unknown age  °• Healthy Father   °• Hypertension Sister   °• Cancer Sister   °• Hypertension Brother   °• Healthy Brother   °• Prostate  cancer Maternal Uncle   °• Colon cancer Neg Hx   ° °Allergies  °Allergen Reactions  °• Lisinopril Swelling  °  angioedema  ° °? °Current Outpatient Medications  °Medication Sig Dispense Refill  °• amLODipine (NORVASC) 2.5 MG tablet Take 1 tablet (2.5 mg total) by mouth daily. 30 tablet 1  °• fluticasone (FLONASE) 50 MCG/ACT nasal spray Place into both nostrils daily.    °• hydrochlorothiazide (HYDRODIURIL) 12.5 MG tablet Take 1 tablet by mouth once daily 90 tablet 1  °• valACYclovir (VALTREX) 1000 MG tablet Take 1,000 mg by mouth 2 (two) times daily as needed.    ° °No current facility-administered medications for this visit.   °  ° °Abtx:  °Anti-infectives (From admission, onward)  ° None  °  ° ° °REVIEW OF SYSTEMS:  °Const: negative fever, negative chills, negative weight loss °Eyes: negative diplopia or visual changes, negative eye pain °ENT: negative coryza, negative sore throat °Resp: negative cough, hemoptysis, dyspnea °Cards: negative for chest pain, palpitations, lower extremity edema °GU: negative for frequency, dysuria and hematuria °GI: Negative for abdominal pain, diarrhea, bleeding, constipation °Skin: negative for rash and pruritus °Heme: negative for easy bruising and gum/nose bleeding °MS: negative for myalgias, arthralgias, back pain and muscle weakness °Neurolo:negative for headaches, dizziness, vertigo, memory problems  °Psych: negative for feelings of anxiety, depression  °Endocrine: negative for thyroid, diabetes issues °Allergy/Immunology-allergic to lisinopril °? ° °Objective:  °VITALS:  °BP 118/80 (BP Location: Left Arm, Patient Position: Sitting, Cuff Size: Large)    Pulse 71    Temp 98 °F (36.7 °C) (Oral)    Ht 5' 1" (1.549 m)    BMI 37.70 kg/m²  °PHYSICAL EXAM:  °General: Alert, cooperative, no distress, appears stated age.  °Head: Normocephalic, without obvious abnormality, atraumatic. °Eyes: Conjunctivae clear, anicteric sclerae. Pupils are equal °ENT Nares normal. No drainage or sinus  tenderness. °Lips, mucosa, and tongue normal. No Thrush °Neck: Supple, symmetrical, no adenopathy, thyroid: non tender °no carotid bruit and no JVD. °Back: No CVA tenderness. °Lungs: Clear to auscultation bilaterally. No Wheezing or Rhonchi. No rales. °Heart: Regular rate and rhythm, no murmur, rub or gallop. °Abdomen: Soft, non-tender,not distended. Bowel sounds normal. No masses °Extremities: atraumatic, no cyanosis. No edema. No clubbing °Skin: No rashes or lesions. Or bruising °Lymph: Cervical, supraclavicular normal. °Neurologic: Grossly non-focal °Pertinent Labs °Lab Results °CBC °   °Component Value Date/Time  ° WBC 6.4 08/31/2019 1344  ° WBC 9.4 04/18/2018 1924  ° RBC 5.75 (H) 08/31/2019 1344  ° RBC 5.91 (H) 04/18/2018 1924  °   HGB 11.1 08/31/2019 1344  ° HCT 37.7 08/31/2019 1344  ° PLT 395 08/31/2019 1344  ° MCV 66 (L) 08/31/2019 1344  ° MCH 19.3 (L) 08/31/2019 1344  ° MCH 20.7 (L) 04/18/2018 1924  ° MCHC 29.4 (L) 08/31/2019 1344  ° MCHC 32.2 04/18/2018 1924  ° RDW 19.5 (H) 08/31/2019 1344  ° LYMPHSABS 2.4 08/31/2019 1344  ° EOSABS 0.1 08/31/2019 1344  ° BASOSABS 0.0 08/31/2019 1344  ° ° °CMP Latest Ref Rng & Units 07/27/2019 06/30/2019 09/22/2018  °Glucose 65 - 99 mg/dL 99 109(H) 113(H)  °BUN 6 - 24 mg/dL 6 11 8  °Creatinine 0.57 - 1.00 mg/dL 0.88 0.80 0.89  °Sodium 134 - 144 mmol/L 136 139 140  °Potassium 3.5 - 5.2 mmol/L 4.2 4.5 3.5  °Chloride 96 - 106 mmol/L 98 99 102  °CO2 20 - 29 mmol/L 23 23 24  °Calcium 8.7 - 10.2 mg/dL 9.6 9.2 9.4  °Total Protein 6.0 - 8.5 g/dL 7.6 7.9 7.0  °Total Bilirubin 0.0 - 1.2 mg/dL 0.3 <0.2 0.4  °Alkaline Phos 39 - 117 IU/L 77 69 60  °AST 0 - 40 IU/L 13 19 15  °ALT 0 - 32 IU/L 9 14 10  ° ° ° ° °Impression/Recommendation °?43-year-old female with FUO.  She actually had fever for 8 weeks and 4 days according to her.  No fever since 08/06/2019. °Unclear etiology for fever.  SARS-CoV-2 was negative.  ESR and CRP were elevated. °Blood culture was negative °Infection versus autoimmune  condition was malignancy were  differential diagnosis but now fever is resolved completely.  As she had a positive QuantiFERON gold there was a concern for tuberculosis but chest x-ray was normal.  LFTs were normal.  Now that the fever is resolved for almost 3 weeks is unlikely this is active tuberculosis. °If the fever recurs then we will investigate with CT chest abdomen and pelvis as well as echo, blood cultures, autoimmune screen. °? °?Positive PPD since 1998.  Not treated for latent TB.  The QuantiFERON gold being positive now is no surprise.  And she is interested in getting treatment for latent TB.  Will wait for a few weeks to make sure there is no fever and then will start treatment ° °Today we will check ESR, CRP, CMV antibody. ° °Patient wanted to know whether she could have Chagas disease as she found bug in 1 of her potted plants in her balcony.  She had eaten the leaf of the plant.  She has a picture.  I will try to discuss with the entomologist of [place] to see whether we have redo bedbugs here. °Discussed the management with the patient in detail ° note:  This document was prepared using Dragon voice recognition software and may include unintentional dictation errors. ° °

## 2019-09-08 ENCOUNTER — Encounter: Payer: Self-pay | Admitting: Family Medicine

## 2019-09-17 ENCOUNTER — Ambulatory Visit (INDEPENDENT_AMBULATORY_CARE_PROVIDER_SITE_OTHER): Payer: Managed Care, Other (non HMO) | Admitting: Family Medicine

## 2019-09-17 ENCOUNTER — Other Ambulatory Visit: Payer: Self-pay

## 2019-09-17 ENCOUNTER — Encounter: Payer: Self-pay | Admitting: Family Medicine

## 2019-09-17 VITALS — BP 128/89 | HR 88 | Temp 97.5°F | Ht 61.0 in | Wt 204.0 lb

## 2019-09-17 DIAGNOSIS — Z1239 Encounter for other screening for malignant neoplasm of breast: Secondary | ICD-10-CM

## 2019-09-17 DIAGNOSIS — Z23 Encounter for immunization: Secondary | ICD-10-CM

## 2019-09-17 DIAGNOSIS — R7303 Prediabetes: Secondary | ICD-10-CM | POA: Diagnosis not present

## 2019-09-17 DIAGNOSIS — N83201 Unspecified ovarian cyst, right side: Secondary | ICD-10-CM

## 2019-09-17 DIAGNOSIS — I10 Essential (primary) hypertension: Secondary | ICD-10-CM

## 2019-09-17 DIAGNOSIS — Z0000 Encounter for general adult medical examination without abnormal findings: Secondary | ICD-10-CM | POA: Diagnosis not present

## 2019-09-17 NOTE — Assessment & Plan Note (Signed)
Discussed importance of healthy weight management Discussed diet and exercise  

## 2019-09-17 NOTE — Progress Notes (Signed)
Patient: Martha Mendoza, Female    DOB: [birth year], 43 y.o.   MRN: [id number] Visit Date: 09/17/2019  Today's Provider: Lavon Paganini, MD   Chief Complaint  Patient presents with  . Annual Exam   Subjective:    I, [name], CMA, am acting as a scribe for Lavon Paganini, MD.    Annual physical exam Martha Mendoza is a 43 y.o. female who presents today for health maintenance and complete physical. She feels well. She reports exercising none. She reports she is sleeping fairly well.  ----------------------------------------------------------------- Found to have complex R ovarian cyst in 03/2018.  Recommended to repeat US in 6-12 weeks. Never had f/u.  Denies any problems.  Review of Systems  Constitutional: Negative.   HENT: Negative.   Eyes: Positive for discharge and itching. Negative for photophobia, pain, redness and visual disturbance.  Respiratory: Negative.   Cardiovascular: Negative.   Gastrointestinal: Positive for abdominal distention, abdominal pain and constipation. Negative for anal bleeding, blood in stool, diarrhea, nausea, rectal pain and vomiting.  Endocrine: Negative.   Genitourinary: Negative.   Musculoskeletal: Positive for joint swelling, neck pain and neck stiffness. Negative for arthralgias, back pain, gait problem and myalgias.  Skin: Negative.   Allergic/Immunologic: Positive for environmental allergies. Negative for food allergies and immunocompromised state.  Neurological: Negative.   Hematological: Negative.   Psychiatric/Behavioral: Negative.     Social History      She  reports that she has never smoked. She has never used smokeless tobacco. She reports that she does not drink alcohol or use drugs.       Social History   Socioeconomic History  . Marital status: Divorced    Spouse name: Not on file  . Number of children: 2  . Years of education: 65  . Highest education level: Bachelor's degree (e.g., BA, AB, BS)  Occupational  History  . 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 Financial resource strain: Not hard at all  . Food insecurity    Worry: Never true    Inability: Never true  . Transportation needs    Medical: No    Non-medical: No  Tobacco Use  . Smoking status: Never Smoker  . Smokeless tobacco: Never Used  Substance and Sexual Activity  . Alcohol use: No  . Drug use: No  . Sexual activity: Not Currently    Partners: Male    Birth control/protection: None  Lifestyle  . Physical activity    Days per week: 0 days    Minutes per session: 0 min  . Stress: Not on file  Relationships  . Social Herbalist on phone: Not on file    Gets together: Not on file    Attends religious service: Not on file    Active member of club or organization: Not on file    Attends meetings of clubs or organizations: Not on file    Relationship status: Not on file  Other Topics Concern  . Not on file  Social History Narrative  . Not on file    Past Medical History:  Diagnosis Date  . Fever blister   . Hypertension   . Iron deficiency anemia   . Vitamin D deficiency      Patient Active Problem List   Diagnosis Date Noted  . Pre-diabetes 03/11/2018  . Tinnitus 03/11/2018  . Anxiety 03/11/2018  . Tension headache 02/10/2018  .  Family history of breast cancer 09/11/2017  . Morbid obesity ([place]) 09/11/2017  . Leg cramping 09/11/2017  . Menorrhagia 09/11/2017  . SI (sacroiliac) joint dysfunction 09/11/2017  . Vitamin D deficiency   . Iron deficiency anemia   . Benign essential hypertension 04/19/2013  . Breast mass 11/02/2012  . Herpes simplex virus (HSV) infection 10/08/2011    Past Surgical History:  Procedure Laterality Date  . BREAST BIOPSY Right 11/28/2015   FIBROADENOMA WITH USUAL DUCTAL HYPERPLASIA  . BREAST EXCISIONAL BIOPSY Left 1999   benign  . ROOT CANAL    . WISDOM TOOTH EXTRACTION  01/2017   x4    Family History         Family Status  Relation Name Status  . Mother  Deceased  . Mat Aunt  (Not Specified)  . Father  Alive  . Sister  Alive  . Brother  Alive  . Brother  Alive  . Mat Uncle  (Not Specified)  . Neg Hx  (Not Specified)        Her family history includes Breast cancer in her maternal aunt; Breast cancer (age of onset: 25) in her mother; Cancer in her sister; Diabetes in her mother; Healthy in her brother and father; Hypertension in her brother, mother, and sister; Prostate cancer in her maternal uncle; Stroke (age of onset: 65) in her mother. There is no history of Colon cancer.      Allergies  Allergen Reactions  . Lisinopril Swelling    angioedema     Current Outpatient Medications:  .  amLODipine (NORVASC) 2.5 MG tablet, Take 1 tablet (2.5 mg total) by mouth daily., Disp: 30 tablet, Rfl: 1 .  fluticasone (FLONASE) 50 MCG/ACT nasal spray, Place into both nostrils daily., Disp: , Rfl:  .  hydrochlorothiazide (HYDRODIURIL) 12.5 MG tablet, Take 1 tablet by mouth once daily, Disp: 90 tablet, Rfl: 1 .  valACYclovir (VALTREX) 1000 MG tablet, Take 1,000 mg by mouth 2 (two) times daily as needed., Disp: , Rfl:    Patient Care Team: Virginia Crews, MD as PCP - General (Family Medicine)    Objective:    Vitals: BP 128/89   Pulse 88   Temp (!) 97.5 F (36.4 C) (Temporal)   Ht 5\' 1"  (1.549 m)   Wt 204 lb (92.5 kg)   SpO2 98%   BMI 38.55 kg/m    Vitals:   09/17/19 1326 09/17/19 1408  BP: 127/90 128/89  Pulse: 88   Temp: (!) 97.5 F (36.4 C)   TempSrc: Temporal   SpO2: 98%   Weight: 204 lb (92.5 kg)   Height: 5\' 1"  (1.549 m)      Physical Exam Vitals signs reviewed.  Constitutional:      General: She is not in acute distress.    Appearance: Normal appearance. She is well-developed. She is not diaphoretic.  HENT:     Head: Normocephalic and atraumatic.     Right Ear: Tympanic membrane, ear canal and external ear normal.     Left Ear: Tympanic membrane, ear  canal and external ear normal.  Eyes:     General: No scleral icterus.    Conjunctiva/sclera: Conjunctivae normal.     Pupils: Pupils are equal, round, and reactive to light.  Neck:     Musculoskeletal: Neck supple.     Thyroid: No thyromegaly.  Cardiovascular:     Rate and Rhythm: Normal rate and regular rhythm.     Pulses: Normal pulses.     Heart  sounds: Normal heart sounds. No murmur.  Pulmonary:     Effort: Pulmonary effort is normal. No respiratory distress.     Breath sounds: Normal breath sounds. No wheezing or rales.  Abdominal:     General: There is no distension.     Palpations: Abdomen is soft.     Tenderness: There is no abdominal tenderness.  Musculoskeletal:        General: No deformity.     Right lower leg: No edema.     Left lower leg: No edema.  Lymphadenopathy:     Cervical: No cervical adenopathy.  Skin:    General: Skin is warm and dry.     Capillary Refill: Capillary refill takes less than 2 seconds.     Findings: No rash.  Neurological:     Mental Status: She is alert and oriented to person, place, and time. Mental status is at baseline.  Psychiatric:        Mood and Affect: Mood normal.        Behavior: Behavior normal.        Thought Content: Thought content normal.     Depression Screen PHQ 2/9 Scores 09/17/2019 03/11/2018 09/11/2017  PHQ - 2 Score 0 0 1  PHQ- 9 Score 6 9 -     Assessment & Plan:     Routine Health Maintenance and Physical Exam  Exercise Activities and Dietary recommendations Goals   None     Immunization History  Administered Date(s) Administered  . Influenza,inj,Quad PF,6+ Mos 09/17/2019  . Tdap 09/28/2010, 06/24/2016    Health Maintenance  Topic Date Due  . PAP SMEAR-Modifier  10/10/2020  . TETANUS/TDAP  06/24/2026  . INFLUENZA VACCINE  Completed  . HIV Screening  Completed     Discussed health benefits of physical activity, and encouraged her to engage in regular exercise appropriate for her age and  condition.    --------------------------------------------------------------------  Problem List Items Addressed This Visit      Cardiovascular and Mediastinum   Benign essential hypertension    Well controlled Continue current medications Recheck metabolic panel F/u in 6 months         Other   Morbid obesity ([place])    Discussed importance of healthy weight management Discussed diet and exercise       Relevant Orders   Lipid panel   Pre-diabetes    Encourage low carb diet Recheck A1c      Relevant Orders   Hemoglobin A1c    Other Visit Diagnoses    Encounter for annual physical exam    -  Primary   Relevant Orders   Lipid panel   Hemoglobin A1c   Screening for breast cancer       Relevant Orders   MM 3D SCREEN BREAST BILATERAL   Right ovarian cyst       Relevant Orders   US Pelvic Complete With Transvaginal   Need for influenza vaccination       Relevant Orders   Flu Vaccine QUAD 6+ mos PF IM (Fluarix Quad PF) (Completed)       Return in about 6 months (around 03/16/2020) for chronic disease f/u.   The entirety of the information documented in the History of Present Illness, Review of Systems and Physical Exam were personally obtained by me. Portions of this information were initially documented by [name], CMA and reviewed by me for thoroughness and accuracy.    Bedford Winsor, Dionne Bucy, MD MPH [facility]

## 2019-09-17 NOTE — Assessment & Plan Note (Signed)
Encourage low carb diet Recheck A1c 

## 2019-09-17 NOTE — Patient Instructions (Signed)

## 2019-09-17 NOTE — Assessment & Plan Note (Signed)
Well controlled Continue current medications Recheck metabolic panel F/u in 6 months  

## 2019-09-18 LAB — HEMOGLOBIN A1C
Est. average glucose Bld gHb Est-mCnc: 131 mg/dL
Hgb A1c MFr Bld: 6.2 % — ABNORMAL HIGH (ref 4.8–5.6)

## 2019-09-18 LAB — LIPID PANEL
Chol/HDL Ratio: 3.7 ratio (ref 0.0–4.4)
Cholesterol, Total: 201 mg/dL — ABNORMAL HIGH (ref 100–199)
HDL: 55 mg/dL (ref >39)
LDL Chol Calc (NIH): 132 mg/dL — ABNORMAL HIGH (ref 0–99)
Triglycerides: 77 mg/dL (ref 0–149)
VLDL Cholesterol Cal: 14 mg/dL (ref 5–40)

## 2019-09-24 ENCOUNTER — Other Ambulatory Visit: Payer: Self-pay

## 2019-09-24 ENCOUNTER — Ambulatory Visit
Admission: RE | Admit: 2019-09-24 | Discharge: 2019-09-24 | Disposition: A | Discharge status: Home / Self Care | Payer: Managed Care, Other (non HMO) | Source: Ambulatory Visit | Attending: Family Medicine | Admitting: Family Medicine

## 2019-09-24 DIAGNOSIS — N83201 Unspecified ovarian cyst, right side: Secondary | ICD-10-CM | POA: Insufficient documentation

## 2019-09-27 ENCOUNTER — Telehealth: Payer: Self-pay

## 2019-09-27 NOTE — Telephone Encounter (Signed)
Patient was advised.  

## 2019-09-27 NOTE — Telephone Encounter (Signed)
-----   Message from Virginia Crews, MD sent at 09/27/2019 11:30 AM EDT ----- 2 small fibroids.  Normal ovaries.

## 2019-10-01 ENCOUNTER — Other Ambulatory Visit: Payer: Self-pay | Admitting: Family Medicine

## 2019-10-01 DIAGNOSIS — I10 Essential (primary) hypertension: Secondary | ICD-10-CM

## 2020-01-27 IMAGING — US US PELVIS COMPLETE WITH TRANSVAGINAL
1 series · 13 of 25 positions shown · non-contrast
Comparison: 04/18/2018

CLINICAL DATA: RIGHT ovarian cyst



[Series 1: us pelvis complete with transvaginal · 13 of 105 slices shown]
[im 1/105]
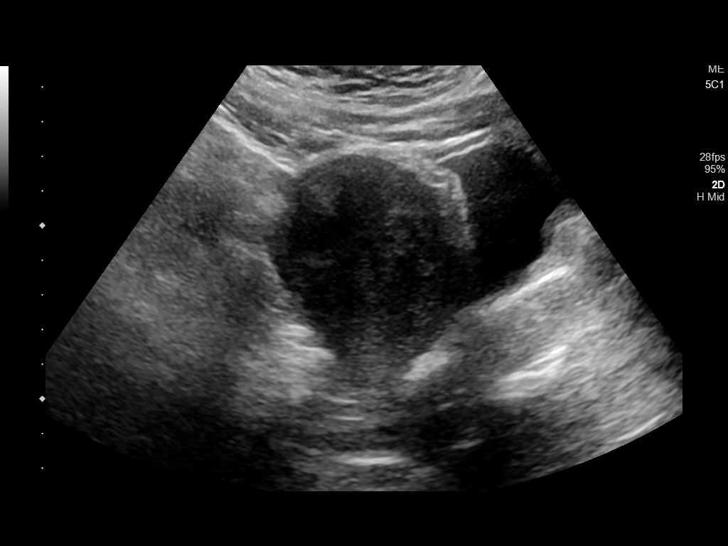
[im 9/105]
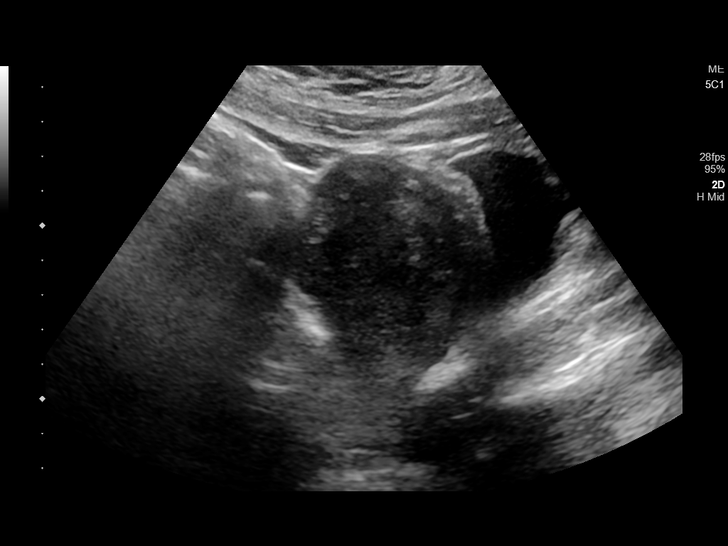
[im 18/105]
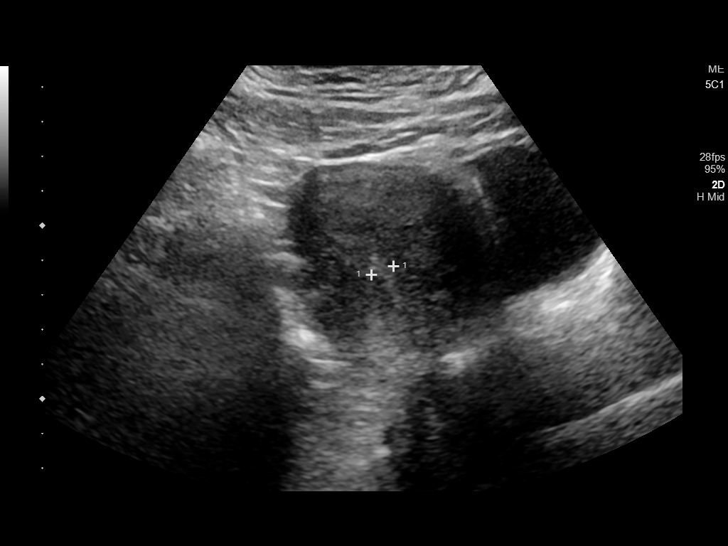
[im 27/105]
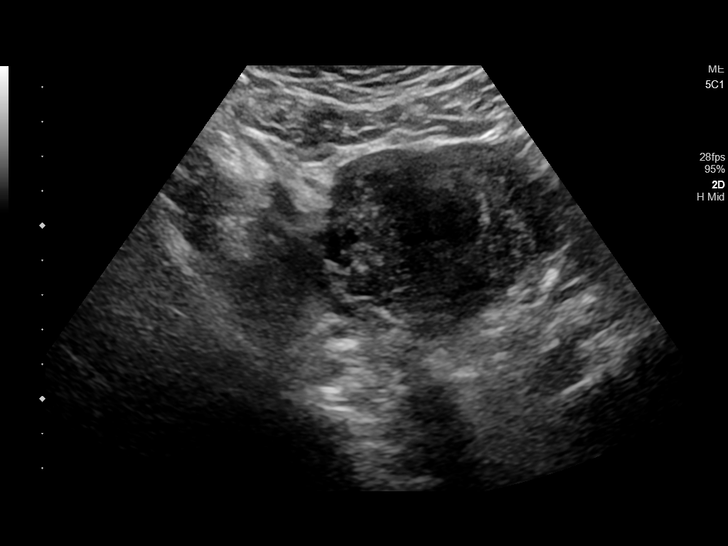
[im 35/105]
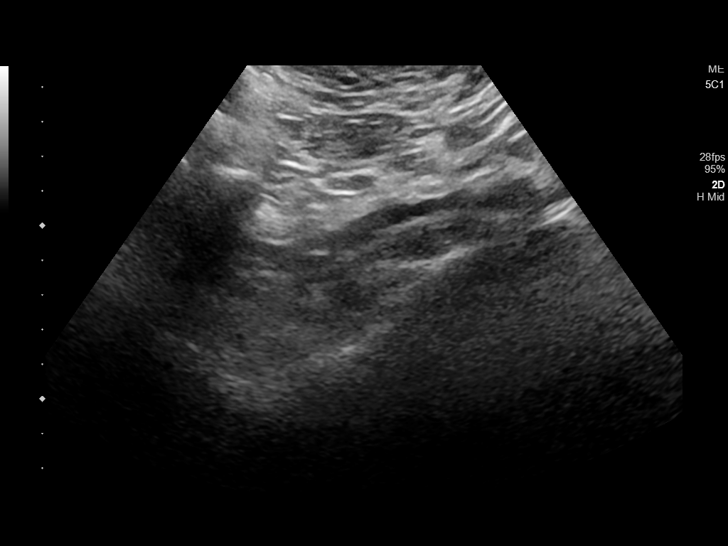
[im 44/105]
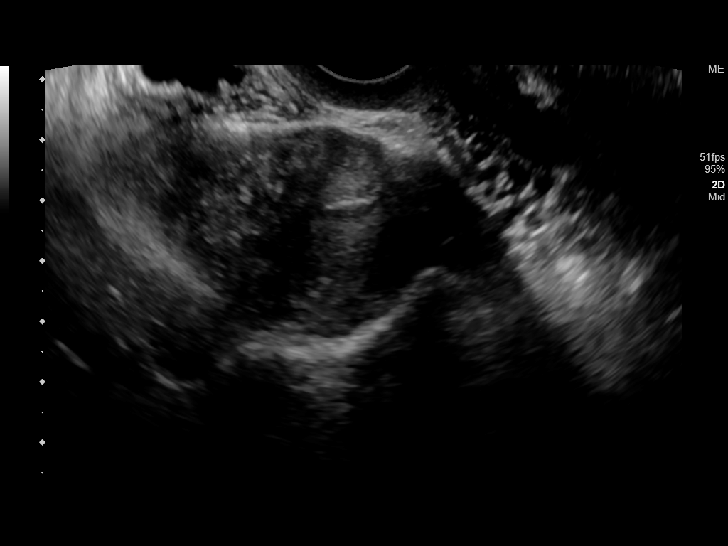
[im 53/105]
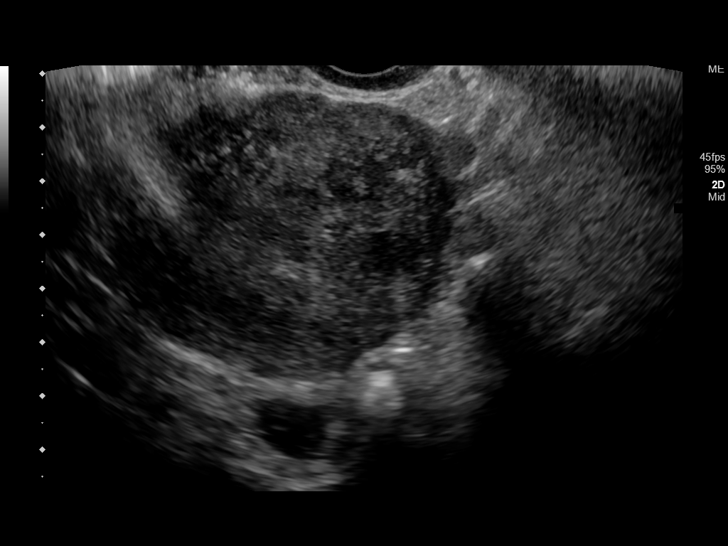
[im 61/105]
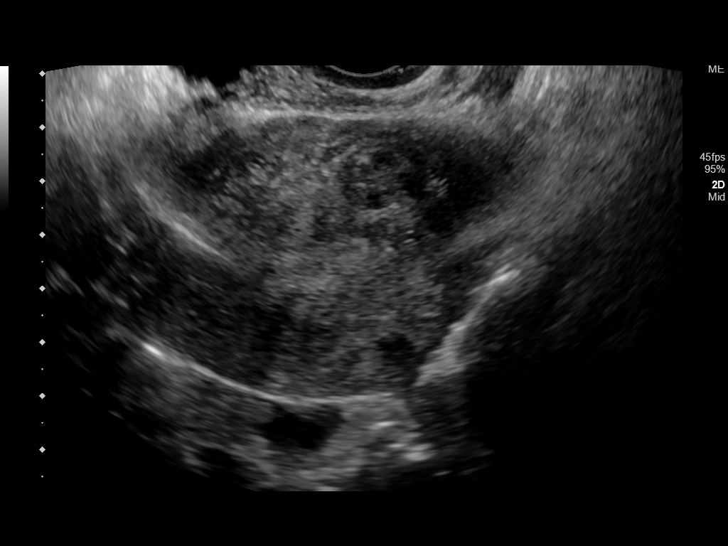
[im 70/105]
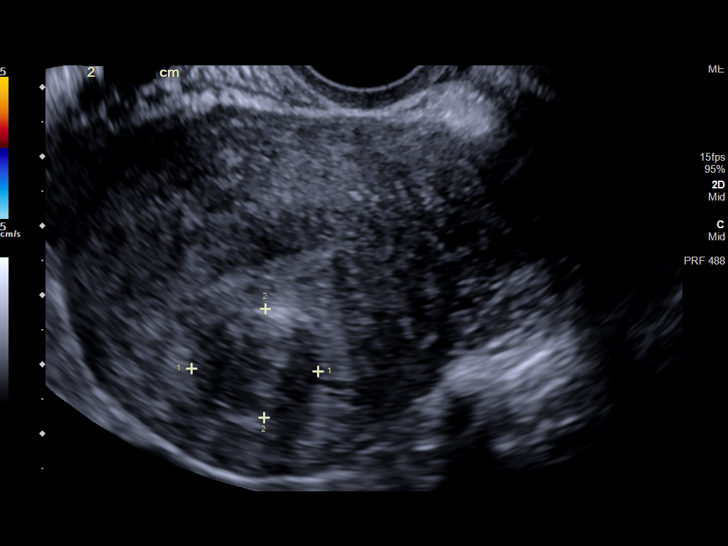
[im 79/105]
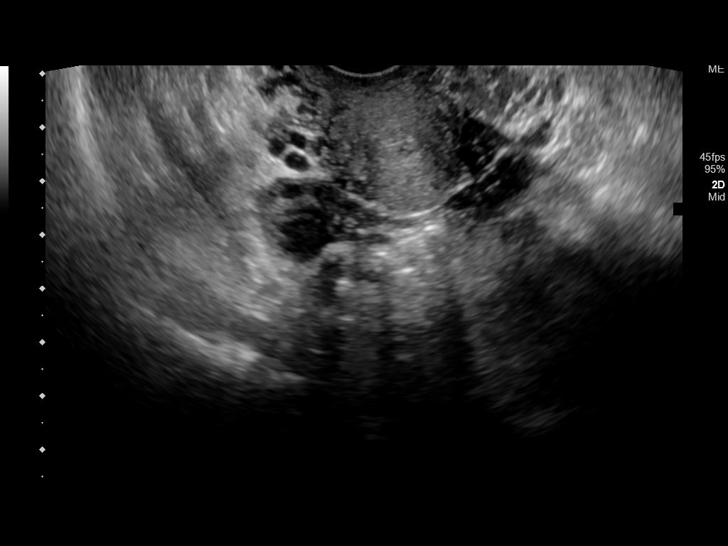
[im 87/105]
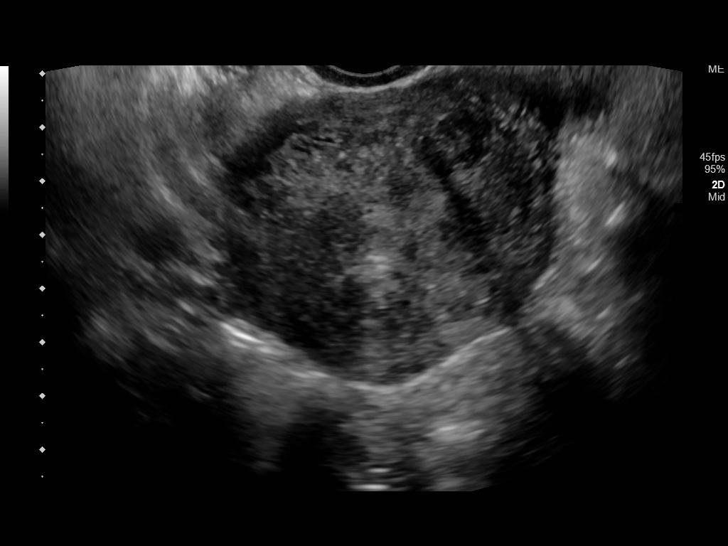
[im 96/105]
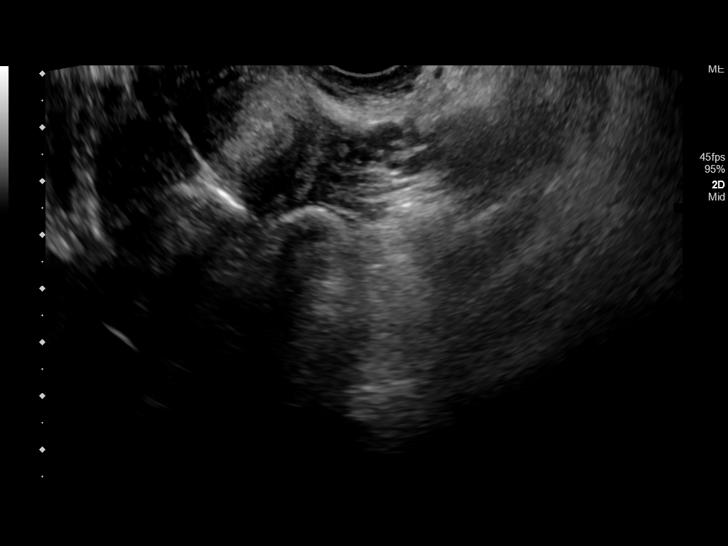
[im 105/105]
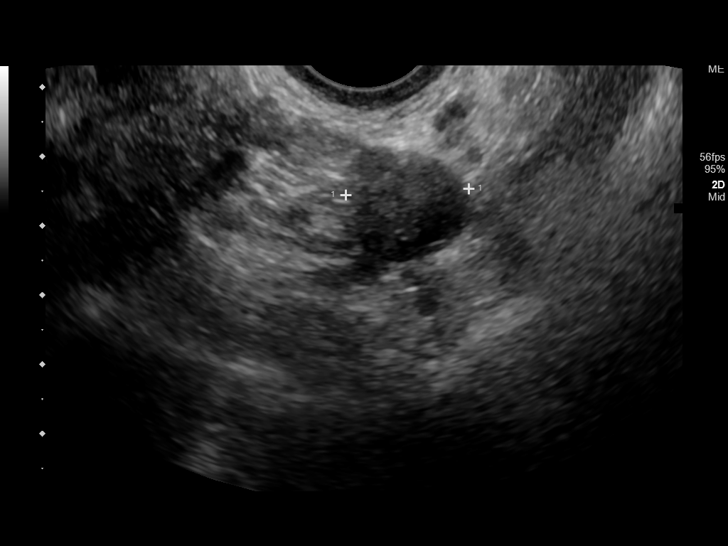

[13 of 25 positions shown; findings below may reference images not displayed]

FINDINGS: Uterus

Measurements: 8.6 x 5.5 x 5.6 cm = volume: 138 mL. Anteverted. Small
posterior upper uterine segment submucosal leiomyoma 16 x 14 x 20
mm. Additional small anterior upper uterine segment leiomyoma, 18 x
16 x 15 mm, submucosal. No additional masses.

Endometrium

Thickness: 5 mm, normal.  No endometrial fluid or focal mass

Right ovary

Measurements: 4.3 x 2.3 x 2.1 cm = volume: 10.8 mL. Normal
morphology without mass

Left ovary

Measurements: 2.9 x 1.4 x 1.8 cm = volume: 3.8 mL. Normal morphology
without mass

Other findings

No free pelvic fluid or adnexal masses.
IMPRESSION: Two small submucosal uterine leiomyomata at the upper uterus as
above.

Otherwise normal exam.

## 2020-02-25 ENCOUNTER — Other Ambulatory Visit: Payer: Self-pay

## 2020-02-25 ENCOUNTER — Ambulatory Visit: Payer: Managed Care, Other (non HMO) | Attending: Internal Medicine

## 2020-02-25 DIAGNOSIS — Z23 Encounter for immunization: Secondary | ICD-10-CM | POA: Insufficient documentation

## 2020-02-25 NOTE — Progress Notes (Signed)
   Covid-19 Vaccination Clinic  Name:  Martha Mendoza    MRN: [id number] DOB: [birth year]  02/25/2020  [name] was observed post Covid-19 immunization for 15 minutes without incident. She was provided with Vaccine Information Sheet and instruction to access the V-Safe system.   [name] was instructed to call 911 with any severe reactions post vaccine: Marland Kitchen Difficulty breathing  . Swelling of face and throat  . A fast heartbeat  . A bad rash all over body  . Dizziness and weakness   Immunizations Administered    Name Date Dose VIS Date Route   Pfizer COVID-19 Vaccine 02/25/2020  8:39 AM 0.3 mL 12/03/2019 Intramuscular   Manufacturer: [place]   Lot: [id number]   Mar[place]: [id number]

## 2020-02-26 ENCOUNTER — Ambulatory Visit: Payer: Managed Care, Other (non HMO)

## 2020-03-16 ENCOUNTER — Other Ambulatory Visit: Payer: Self-pay

## 2020-03-16 ENCOUNTER — Encounter: Payer: Self-pay | Admitting: Family Medicine

## 2020-03-16 ENCOUNTER — Ambulatory Visit: Payer: Managed Care, Other (non HMO) | Admitting: Family Medicine

## 2020-03-16 VITALS — BP 130/75 | HR 79 | Temp 96.1°F | Wt 201.0 lb

## 2020-03-16 DIAGNOSIS — E559 Vitamin D deficiency, unspecified: Secondary | ICD-10-CM | POA: Diagnosis not present

## 2020-03-16 DIAGNOSIS — I10 Essential (primary) hypertension: Secondary | ICD-10-CM | POA: Diagnosis not present

## 2020-03-16 DIAGNOSIS — R7303 Prediabetes: Secondary | ICD-10-CM

## 2020-03-16 LAB — POCT GLYCOSYLATED HEMOGLOBIN (HGB A1C): Hemoglobin A1C: 6.2 % — AB (ref 4.0–5.6)

## 2020-03-16 NOTE — Patient Instructions (Signed)

## 2020-03-16 NOTE — Assessment & Plan Note (Signed)
Discussed importance of healthy weight management Discussed diet and exercise  

## 2020-03-16 NOTE — Assessment & Plan Note (Signed)
Discussed low carb diet Stable A1c

## 2020-03-16 NOTE — Assessment & Plan Note (Signed)
Well controlled Continue current medications Recheck metabolic panel F/u in 6 months  

## 2020-03-16 NOTE — Assessment & Plan Note (Signed)
Recheck Vit D level 

## 2020-03-16 NOTE — Progress Notes (Signed)
I,Talyssa Gibas,acting as a Education administrator for Lavon Paganini, MD.,have documented all relevant documentation on the behalf of Lavon Paganini, MD,as directed by  Lavon Paganini, MD while in the presence of Lavon Paganini, MD.   Patient: Martha Mendoza Female    DOB: Sep 09, 1976   44 y.o.   MRN: [id number] Visit Date: 03/16/2020  Today's Provider: Lavon Paganini, MD   Chief Complaint  Patient presents with  . Hypertension  . Hyperglycemia   Subjective:     HPI     Hypertension, follow-up:  BP Readings from Last 3 Encounters:  03/16/20 130/75  09/17/19 128/89  09/02/19 118/80    She was last seen for hypertension 6 months ago.  BP at that visit was 128/89. Management since that visit includes no changes. She reports excellent compliance with treatment. She is not having side effects.  She is not exercising. She is adherent to low salt diet.   Outside blood pressures are 120's/80's. She is experiencing Dizziness.  Patient denies chest pain, dyspnea, irregular heart beat, lower extremity edema and syncope.   Cardiovascular risk factors include hypertension and obesity (BMI >= 30 kg/m2).  Use of agents associated with hypertension: none.     Weight trend: stable Wt Readings from Last 3 Encounters:  03/16/20 201 lb (91.2 kg)  09/17/19 204 lb (92.5 kg)  08/12/19 199 lb 8 oz (90.5 kg)    Current diet: in general, a "healthy" diet    ------------------------------------------------------------------------   Hyperglycemia, Follow-up:   Lab Results  Component Value Date   HGBA1C 6.2 (A) 03/16/2020   HGBA1C 6.2 (H) 09/17/2019   HGBA1C 6.1 (H) 09/22/2018   GLUCOSE 99 07/27/2019   GLUCOSE 109 (H) 06/30/2019   GLUCOSE 113 (H) 09/22/2018    Last seen for for this 6 months ago.  Management since then includes work on lifestyle changes. Current symptoms include none and have been stable.  Weight trend: stable Current diet: in general, a "healthy" diet     Current exercise: none  Pertinent Labs:    Component Value Date/Time   CHOL 201 (H) 09/17/2019 1401   TRIG 77 09/17/2019 1401   CHOLHDL 3.7 09/17/2019 1401   CREATININE 0.88 07/27/2019 0954    Wt Readings from Last 3 Encounters:  03/16/20 201 lb (91.2 kg)  09/17/19 204 lb (92.5 kg)  08/12/19 199 lb 8 oz (90.5 kg)   ------------------------------------------------------------------------------------    Allergies  Allergen Reactions  . Lisinopril Swelling    angioedema     Current Outpatient Medications:  .  amLODipine (NORVASC) 2.5 MG tablet, Take 1 tablet by mouth once daily, Disp: 90 tablet, Rfl: 1 .  fluticasone (FLONASE) 50 MCG/ACT nasal spray, Place into both nostrils daily., Disp: , Rfl:  .  hydrochlorothiazide (HYDRODIURIL) 12.5 MG tablet, Take 1 tablet by mouth once daily, Disp: 90 tablet, Rfl: 1 .  valACYclovir (VALTREX) 1000 MG tablet, Take 1,000 mg by mouth 2 (two) times daily as needed., Disp: , Rfl:   Review of Systems  Constitutional: Negative.   Respiratory: Negative.   Cardiovascular: Negative.   Genitourinary: Negative.   Neurological: Negative.   Psychiatric/Behavioral: Negative.     Social History   Tobacco Use  . Smoking status: Never Smoker  . Smokeless tobacco: Never Used  Substance Use Topics  . Alcohol use: No      Objective:   BP 130/75   Pulse 79   Temp (!) 96.1 F (35.6 C) (Temporal)   Wt 201 lb (91.2 kg)  BMI 37.98 kg/m  Vitals:   03/16/20 1330 03/16/20 1338  BP: (!) 135/92 130/75  Pulse: 79   Temp: (!) 96.1 F (35.6 C)   TempSrc: Temporal   Weight: 201 lb (91.2 kg)   Body mass index is 37.98 kg/m.   Physical Exam Vitals reviewed.  Constitutional:      General: She is not in acute distress.    Appearance: Normal appearance. She is well-developed. She is not diaphoretic.  HENT:     Head: Normocephalic and atraumatic.  Eyes:     General: No scleral icterus.    Conjunctiva/sclera: Conjunctivae normal.  Neck:      Thyroid: No thyromegaly.  Cardiovascular:     Rate and Rhythm: Normal rate and regular rhythm.     Pulses: Normal pulses.     Heart sounds: Normal heart sounds. No murmur.  Pulmonary:     Effort: Pulmonary effort is normal. No respiratory distress.     Breath sounds: Normal breath sounds. No wheezing, rhonchi or rales.  Musculoskeletal:     Cervical back: Neck supple.     Right lower leg: No edema.     Left lower leg: No edema.  Lymphadenopathy:     Cervical: No cervical adenopathy.  Skin:    General: Skin is warm and dry.     Findings: No rash.  Neurological:     Mental Status: She is alert and oriented to person, place, and time. Mental status is at baseline.  Psychiatric:        Mood and Affect: Mood normal.        Behavior: Behavior normal.      Results for orders placed or performed in visit on 03/16/20  POCT glycosylated hemoglobin (Hb A1C)  Result Value Ref Range   Hemoglobin A1C 6.2 (A) 4.0 - 5.6 %       Assessment & Plan    Problem List Items Addressed This Visit      Cardiovascular and Mediastinum   Benign essential hypertension - Primary    Well controlled Continue current medications Recheck metabolic panel F/u in 6 months       Relevant Orders   Basic Metabolic Panel (BMET)     Other   Vitamin D deficiency    Recheck Vit D level      Relevant Orders   VITAMIN D 25 Hydroxy (Vit-D Deficiency, Fractures)   Morbid obesity ([place])    Discussed importance of healthy weight management Discussed diet and exercise       Pre-diabetes    Discussed low carb diet Stable A1c      Relevant Orders   POCT glycosylated hemoglobin (Hb A1C) (Completed)       Return in about 6 months (around 09/16/2020) for CPE.   The entirety of the information documented in the History of Present Illness, Review of Systems and Physical Exam were personally obtained by me. Portions of this information were initially documented by Ashley Royalty, CMA and reviewed by me  for thoroughness and accuracy.    Hatice Bubel, Dionne Bucy, MD MPH [facility]

## 2020-03-17 ENCOUNTER — Telehealth: Payer: Self-pay

## 2020-03-17 LAB — BASIC METABOLIC PANEL
BUN/Creatinine Ratio: 9 (ref 9–23)
BUN: 8 mg/dL (ref 6–24)
CO2: 26 mmol/L (ref 20–29)
Calcium: 9.6 mg/dL (ref 8.7–10.2)
Chloride: 101 mmol/L (ref 96–106)
Creatinine, Ser: 0.9 mg/dL (ref 0.57–1.00)
GFR calc Af Amer: 90 mL/min/{1.73_m2} (ref >59)
GFR calc non Af Amer: 78 mL/min/{1.73_m2} (ref >59)
Glucose: 85 mg/dL (ref 65–99)
Potassium: 3.9 mmol/L (ref 3.5–5.2)
Sodium: 139 mmol/L (ref 134–144)

## 2020-03-17 LAB — VITAMIN D 25 HYDROXY (VIT D DEFICIENCY, FRACTURES): Vit D, 25-Hydroxy: 21.9 ng/mL — ABNORMAL LOW (ref 30.0–100.0)

## 2020-03-17 NOTE — Telephone Encounter (Signed)
-----   Message from Virginia Crews, MD sent at 03/17/2020  8:23 AM EDT ----- Normal labs, except low Vit D.  Recommend Vit D3 1000-2000 units daily

## 2020-03-17 NOTE — Telephone Encounter (Signed)
[employer]   Result Notes and Comments to Patient Comment seen by patient Mayzell Banken on 03/17/2020 8:37 AM EDT

## 2020-04-05 ENCOUNTER — Ambulatory Visit: Payer: Managed Care, Other (non HMO)

## 2020-04-05 ENCOUNTER — Ambulatory Visit: Payer: Managed Care, Other (non HMO) | Attending: Internal Medicine

## 2020-04-05 DIAGNOSIS — Z23 Encounter for immunization: Secondary | ICD-10-CM

## 2020-04-05 NOTE — Progress Notes (Signed)
   Covid-19 Vaccination Clinic  Name:  Martha Mendoza    MRN: [id number] DOB: [birth year]  04/05/2020  Ms. Martha Mendoza was observed post Covid-19 immunization for 15 minutes without incident. She was provided with Vaccine Information Sheet and instruction to access the V-Safe system.   Ms. Martha Mendoza was instructed to call 911 with any severe reactions post vaccine: Marland Kitchen Difficulty breathing  . Swelling of face and throat  . A fast heartbeat  . A bad rash all over body  . Dizziness and weakness   Immunizations Administered    Name Date Dose VIS Date Route   Pfizer COVID-19 Vaccine 04/05/2020  8:57 AM 0.3 mL 12/03/2019 Intramuscular   Manufacturer: [place]   Lot: [id number]   [place]: [id number]

## 2020-04-13 ENCOUNTER — Other Ambulatory Visit: Payer: Self-pay | Admitting: Family Medicine

## 2020-04-13 DIAGNOSIS — I10 Essential (primary) hypertension: Secondary | ICD-10-CM

## 2020-04-13 MED ORDER — VALACYCLOVIR HCL 1 G PO TABS: 1000.0000 mg | ORAL_TABLET | Freq: Two times a day (BID) | ORAL | 3 refills | Status: DC | PRN

## 2020-04-13 MED ORDER — HYDROCHLOROTHIAZIDE 12.5 MG PO TABS: 12.5000 mg | ORAL_TABLET | Freq: Every day | ORAL | 1 refills | Status: DC

## 2020-04-13 MED ORDER — AMLODIPINE BESYLATE 2.5 MG PO TABS: 2.5000 mg | ORAL_TABLET | Freq: Every day | ORAL | 1 refills | Status: DC

## 2020-04-13 NOTE — Telephone Encounter (Signed)
   Future visit scheduled:yes  Notes to clinic:  last filled by historical provider  Review for refill   Requested Prescriptions  Pending Prescriptions Disp Refills   valACYclovir (VALTREX) 1000 MG tablet      Sig: Take 1 tablet (1,000 mg total) by mouth 2 (two) times daily as needed.      Antimicrobials:  Antiviral Agents - Anti-Herpetic Passed - 04/13/2020 12:10 PM      Passed - Valid encounter within last 12 months    Recent Outpatient Visits           4 weeks ago Benign essential hypertension   [place], Dionne Bucy, MD   6 months ago Encounter for annual physical exam   [facility], Dionne Bucy, MD   8 months ago Persistent fever   [facility], Dionne Bucy, MD   9 months ago Benign essential hypertension   [facility], Dionne Bucy, MD   9 months ago Suspected Covid-19 Virus Infection   [facility], Dionne Bucy, MD               Signed Prescriptions Disp Refills   hydrochlorothiazide (HYDRODIURIL) 12.5 MG tablet 90 tablet 1    Sig: Take 1 tablet (12.5 mg total) by mouth daily.      Cardiovascular: Diuretics - Thiazide Passed - 04/13/2020 12:10 PM      Passed - Ca in normal range and within 360 days    Calcium  Date Value Ref Range Status  03/16/2020 9.6 8.7 - 10.2 mg/dL Final          Passed - Cr in normal range and within 360 days    Creatinine, Ser  Date Value Ref Range Status  03/16/2020 0.90 0.57 - 1.00 mg/dL Final          Passed - K in normal range and within 360 days    Potassium  Date Value Ref Range Status  03/16/2020 3.9 3.5 - 5.2 mmol/L Final          Passed - Na in normal range and within 360 days    Sodium  Date Value Ref Range Status  03/16/2020 139 134 - 144 mmol/L Final          Passed - Last BP in normal range    BP Readings from Last 1 Encounters:  03/16/20 130/75          Passed - Valid encounter within  last 6 months    Recent Outpatient Visits           4 weeks ago Benign essential hypertension   [place], Dionne Bucy, MD   6 months ago Encounter for annual physical exam   [place], Dionne Bucy, MD   8 months ago Persistent fever   [place], Dionne Bucy, MD   9 months ago Benign essential hypertension   [facility], Dionne Bucy, MD   9 months ago Suspected Covid-19 Virus Infection   [facility], Dionne Bucy, MD

## 2020-04-13 NOTE — Telephone Encounter (Signed)
[facility] faxed refill request for the following medications:   amLODipine (NORVASC) 2.5 MG tablet   Please advise.  

## 2020-04-13 NOTE — Telephone Encounter (Signed)
Copied from [place] [id number]. Topic: Quick Communication - Rx Refill/Question >> Apr 13, 2020 12:03 PM Rainey Pines A wrote: Medication: hydrochlorothiazide (HYDRODIURIL) 12.5 MG tablet,valACYclovir (VALTREX) 1000 MG tablet   Has the patient contacted their pharmacy? Yes (Agent: If no, request that the patient contact the pharmacy for the refill.) (Agent: If yes, when and what did the pharmacy advise?)Contact PCP  Preferred Pharmacy (with phone number or street name): [facility] [id number], [place]  Phone:  [phone] Fax:  [phone]     Agent: Please be advised that RX refills may take up to 3 business days. We ask that you follow-up with your pharmacy.

## 2020-09-21 ENCOUNTER — Encounter: Payer: Managed Care, Other (non HMO) | Admitting: Family Medicine

## 2020-10-11 ENCOUNTER — Encounter: Payer: Managed Care, Other (non HMO) | Admitting: Family Medicine

## 2020-10-27 ENCOUNTER — Other Ambulatory Visit: Payer: Self-pay

## 2020-10-27 ENCOUNTER — Ambulatory Visit (LOCAL_COMMUNITY_HEALTH_CENTER): Payer: Self-pay

## 2020-10-27 DIAGNOSIS — R7612 Nonspecific reaction to cell mediated immunity measurement of gamma interferon antigen response without active tuberculosis: Secondary | ICD-10-CM

## 2020-10-27 NOTE — Progress Notes (Signed)
Here for TB Screen only. Needs it for new job. Hx +QFT 03/03/2019 and 07/27/2019. Chest xray negative on 06/30/2019.  Pt says she has never been tx for LTBI. TB Screen completed and copy given to pt. Josie Saunders, RN

## 2021-02-13 ENCOUNTER — Encounter: Payer: Self-pay | Admitting: Family Medicine

## 2021-02-13 ENCOUNTER — Other Ambulatory Visit: Payer: Self-pay

## 2021-02-13 ENCOUNTER — Ambulatory Visit (INDEPENDENT_AMBULATORY_CARE_PROVIDER_SITE_OTHER): Payer: Managed Care, Other (non HMO) | Admitting: Family Medicine

## 2021-02-13 VITALS — BP 151/100 | HR 66 | Temp 98.5°F | Ht 62.0 in | Wt 199.6 lb

## 2021-02-13 DIAGNOSIS — E669 Obesity, unspecified: Secondary | ICD-10-CM

## 2021-02-13 DIAGNOSIS — E559 Vitamin D deficiency, unspecified: Secondary | ICD-10-CM

## 2021-02-13 DIAGNOSIS — Z1159 Encounter for screening for other viral diseases: Secondary | ICD-10-CM

## 2021-02-13 DIAGNOSIS — I10 Essential (primary) hypertension: Secondary | ICD-10-CM

## 2021-02-13 DIAGNOSIS — R7303 Prediabetes: Secondary | ICD-10-CM | POA: Diagnosis not present

## 2021-02-13 DIAGNOSIS — Z6836 Body mass index (BMI) 36.0-36.9, adult: Secondary | ICD-10-CM

## 2021-02-13 MED ORDER — AMLODIPINE BESYLATE 5 MG PO TABS: 5.0000 mg | ORAL_TABLET | Freq: Every day | ORAL | 1 refills | Status: DC

## 2021-02-13 MED ORDER — HYDROCHLOROTHIAZIDE 12.5 MG PO TABS: 12.5000 mg | ORAL_TABLET | Freq: Every day | ORAL | 1 refills | Status: DC

## 2021-02-13 NOTE — Assessment & Plan Note (Signed)
Discussed importance of healthy weight management Discussed diet and exercise  Screening labs 

## 2021-02-13 NOTE — Assessment & Plan Note (Signed)
Recommend low carb diet °Recheck A1c  °

## 2021-02-13 NOTE — Assessment & Plan Note (Signed)
Recheck level 

## 2021-02-13 NOTE — Progress Notes (Signed)
Established patient visit   Patient: Martha Mendoza   DOB: 11/30/1976   45 y.o. Female  MRN: [id number] Visit Date: 02/13/2021  Today's healthcare provider: Lavon Paganini, MD   Chief Complaint  Patient presents with  . Hypertension  . Diabetes   Subjective    HPI  Hypertension, follow-up  BP Readings from Last 3 Encounters:  02/13/21 (!) 151/100  03/16/20 130/75  09/17/19 128/89   Wt Readings from Last 3 Encounters:  02/13/21 199 lb 9.6 oz (90.5 kg)  03/16/20 201 lb (91.2 kg)  09/17/19 204 lb (92.5 kg)     She was last seen for hypertension 11 months ago.  BP at that visit was 130/75. Management since that visit includes no changes.  She reports excellent compliance with treatment. She is not having side effects.  She is following a Regular diet. She is exercising. 2-3 times per week. She does not smoke.  Use of agents associated with hypertension: none.   Outside blood pressures are all over the place according to the pt.  Symptoms:   No chest pain No chest pressure  No palpitations No syncope  No dyspnea No orthopnea  No paroxysmal nocturnal dyspnea No lower extremity edema   Pertinent labs: Lab Results  Component Value Date   CHOL 201 (H) 09/17/2019   HDL 55 09/17/2019   LDLCALC 132 (H) 09/17/2019   TRIG 77 09/17/2019   CHOLHDL 3.7 09/17/2019   Lab Results  Component Value Date   NA 139 03/16/2020   K 3.9 03/16/2020   CREATININE 0.90 03/16/2020   GFRNONAA 78 03/16/2020   GFRAA 90 03/16/2020   GLUCOSE 85 03/16/2020     The 10-year ASCVD risk score Mikey Bussing DC Jr., et al., 2013) is: 5.1%   --------------------------------------------------------------------------------------------------- Prediabetes, Follow-up  Lab Results  Component Value Date   HGBA1C 6.2 (A) 03/16/2020   HGBA1C 6.2 (H) 09/17/2019   HGBA1C 6.1 (H) 09/22/2018   GLUCOSE 85 03/16/2020   GLUCOSE 99 07/27/2019   GLUCOSE 109 (H) 06/30/2019    Last seen for for this11  months ago.  Management since that visit includes low carb diet. Current symptoms include none and have been N/A.  Prior visit with dietician: no Current diet: in general, an "unhealthy" diet Current exercise: none  Pertinent Labs:    Component Value Date/Time   CHOL 201 (H) 09/17/2019 1401   TRIG 77 09/17/2019 1401   CHOLHDL 3.7 09/17/2019 1401   CREATININE 0.90 03/16/2020 1421    Wt Readings from Last 3 Encounters:  02/13/21 199 lb 9.6 oz (90.5 kg)  03/16/20 201 lb (91.2 kg)  09/17/19 204 lb (92.5 kg)    -----------------------------------------------------------------------------------------   Patient Active Problem List   Diagnosis Date Noted  . Pre-diabetes 03/11/2018  . Tinnitus 03/11/2018  . Anxiety 03/11/2018  . Tension headache 02/10/2018  . Family history of breast cancer 09/11/2017  . Obesity 09/11/2017  . Leg cramping 09/11/2017  . Menorrhagia 09/11/2017  . SI (sacroiliac) joint dysfunction 09/11/2017  . Vitamin D deficiency   . Iron deficiency anemia   . Benign essential hypertension 04/19/2013  . Breast mass 11/02/2012  . Herpes simplex virus (HSV) infection 10/08/2011   Social History   Tobacco Use  . Smoking status: Never Smoker  . Smokeless tobacco: Never Used  Vaping Use  . Vaping Use: Never used  Substance Use Topics  . Alcohol use: No  . Drug use: No   Allergies  Allergen Reactions  . Lisinopril Swelling  angioedema       Medications: Outpatient Medications Prior to Visit  Medication Sig  . [DISCONTINUED] amLODipine (NORVASC) 2.5 MG tablet Take 1 tablet (2.5 mg total) by mouth daily.  . [DISCONTINUED] hydrochlorothiazide (HYDRODIURIL) 12.5 MG tablet Take 1 tablet (12.5 mg total) by mouth daily.  . [DISCONTINUED] fluticasone (FLONASE) 50 MCG/ACT nasal spray Place into both nostrils daily. (Patient not taking: Reported on 02/13/2021)  . [DISCONTINUED] valACYclovir (VALTREX) 1000 MG tablet Take 1 tablet (1,000 mg total) by mouth 2  (two) times daily as needed. (Patient not taking: Reported on 02/13/2021)   No facility-administered medications prior to visit.    Review of Systems  Constitutional: Negative for appetite change, fatigue and unexpected weight change.  Respiratory: Negative for cough, shortness of breath and wheezing.   Cardiovascular: Negative for chest pain, palpitations and leg swelling.    Last CBC Lab Results  Component Value Date   WBC 6.4 08/31/2019   HGB 11.1 08/31/2019   HCT 37.7 08/31/2019   MCV 66 (L) 08/31/2019   MCH 19.3 (L) 08/31/2019   RDW 19.5 (H) 08/31/2019   PLT 395 08/31/2019   Last thyroid functions Lab Results  Component Value Date   TSH 1.350 07/27/2019        Objective    BP (!) 151/100   Pulse 66   Temp 98.5 F (36.9 C) (Oral)   Ht 5\' 2"  (1.575 m)   Wt 199 lb 9.6 oz (90.5 kg)   BMI 36.51 kg/m  BP Readings from Last 3 Encounters:  02/13/21 (!) 151/100  03/16/20 130/75  09/17/19 128/89   Wt Readings from Last 3 Encounters:  02/13/21 199 lb 9.6 oz (90.5 kg)  03/16/20 201 lb (91.2 kg)  09/17/19 204 lb (92.5 kg)      Physical Exam Vitals reviewed.  Constitutional:      General: She is not in acute distress.    Appearance: Normal appearance. She is well-developed. She is not diaphoretic.  HENT:     Head: Normocephalic and atraumatic.  Eyes:     General: No scleral icterus.    Conjunctiva/sclera: Conjunctivae normal.  Neck:     Thyroid: No thyromegaly.  Cardiovascular:     Rate and Rhythm: Normal rate and regular rhythm.     Pulses: Normal pulses.     Heart sounds: Normal heart sounds. No murmur heard.   Pulmonary:     Effort: Pulmonary effort is normal. No respiratory distress.     Breath sounds: Normal breath sounds. No wheezing, rhonchi or rales.  Musculoskeletal:     Cervical back: Neck supple.     Right lower leg: No edema.     Left lower leg: No edema.  Lymphadenopathy:     Cervical: No cervical adenopathy.  Skin:    General: Skin is  warm and dry.     Findings: No rash.  Neurological:     Mental Status: She is alert and oriented to person, place, and time. Mental status is at baseline.  Psychiatric:        Mood and Affect: Mood normal.        Behavior: Behavior normal.       No results found for any visits on 02/13/21.  Assessment & Plan     Problem List Items Addressed This Visit      Cardiovascular and Mediastinum   Benign essential hypertension - Primary    Uncontrolled Continue HCTZ 12.5 mg daily - did not tolerate 25mg  dose due to dehydration  Increase amlodipine to 5mg  daily Recheck metabolic panel F/u in 1 month Monitor home BPs      Relevant Medications   amLODipine (NORVASC) 5 MG tablet   hydrochlorothiazide (HYDRODIURIL) 12.5 MG tablet   Other Relevant Orders   Comprehensive metabolic panel     Other   Vitamin D deficiency    Recheck level      Relevant Orders   VITAMIN D 25 Hydroxy (Vit-D Deficiency, Fractures)   Obesity    Discussed importance of healthy weight management Discussed diet and exercise Screening labs      Relevant Orders   Comprehensive metabolic panel   Lipid panel   Pre-diabetes    Recommend low carb diet Recheck A1c       Relevant Orders   Hemoglobin A1c    Other Visit Diagnoses    Need for hepatitis C screening test       Relevant Orders   Hepatitis C Antibody       Return in about 4 weeks (around 03/13/2021) for BP f/u, virtual ok.      I, Lavon Paganini, MD, have reviewed all documentation for this visit. The documentation on 02/13/21 for the exam, diagnosis, procedures, and orders are all accurate and complete.   Orvan Papadakis, Dionne Bucy, MD, MPH [facility]

## 2021-02-13 NOTE — Assessment & Plan Note (Signed)
Uncontrolled Continue HCTZ 12.5 mg daily - did not tolerate 25mg  dose due to dehydration Increase amlodipine to 5mg  daily Recheck metabolic panel F/u in 1 month Monitor home BPs

## 2021-02-13 NOTE — Patient Instructions (Signed)

## 2021-02-14 LAB — HEMOGLOBIN A1C
Est. average glucose Bld gHb Est-mCnc: 120 mg/dL
Hgb A1c MFr Bld: 5.8 % — ABNORMAL HIGH (ref 4.8–5.6)

## 2021-02-14 LAB — LIPID PANEL
Chol/HDL Ratio: 3.3 ratio (ref 0.0–4.4)
Cholesterol, Total: 193 mg/dL (ref 100–199)
HDL: 59 mg/dL (ref >39)
LDL Chol Calc (NIH): 118 mg/dL — ABNORMAL HIGH (ref 0–99)
Triglycerides: 90 mg/dL (ref 0–149)
VLDL Cholesterol Cal: 16 mg/dL (ref 5–40)

## 2021-02-14 LAB — COMPREHENSIVE METABOLIC PANEL
ALT: 10 IU/L (ref 0–32)
AST: 17 IU/L (ref 0–40)
Albumin/Globulin Ratio: 1.1 — ABNORMAL LOW (ref 1.2–2.2)
Albumin: 4.2 g/dL (ref 3.8–4.8)
Alkaline Phosphatase: 73 IU/L (ref 44–121)
BUN/Creatinine Ratio: 11 (ref 9–23)
BUN: 10 mg/dL (ref 6–24)
Bilirubin Total: 0.3 mg/dL (ref 0.0–1.2)
CO2: 21 mmol/L (ref 20–29)
Calcium: 10 mg/dL (ref 8.7–10.2)
Chloride: 99 mmol/L (ref 96–106)
Creatinine, Ser: 0.88 mg/dL (ref 0.57–1.00)
GFR calc Af Amer: 92 mL/min/{1.73_m2} (ref >59)
GFR calc non Af Amer: 80 mL/min/{1.73_m2} (ref >59)
Globulin, Total: 3.7 g/dL (ref 1.5–4.5)
Glucose: 79 mg/dL (ref 65–99)
Potassium: 3.9 mmol/L (ref 3.5–5.2)
Sodium: 140 mmol/L (ref 134–144)
Total Protein: 7.9 g/dL (ref 6.0–8.5)

## 2021-02-14 LAB — HEPATITIS C ANTIBODY: Hep C Virus Ab: <0.1 s/co ratio (ref 0.0–0.9)

## 2021-02-14 LAB — VITAMIN D 25 HYDROXY (VIT D DEFICIENCY, FRACTURES): Vit D, 25-Hydroxy: 35.5 ng/mL (ref 30.0–100.0)

## 2021-03-13 ENCOUNTER — Telehealth (INDEPENDENT_AMBULATORY_CARE_PROVIDER_SITE_OTHER): Payer: 59 | Admitting: Family Medicine

## 2021-03-13 ENCOUNTER — Encounter: Payer: Self-pay | Admitting: Family Medicine

## 2021-03-13 VITALS — BP 128/88

## 2021-03-13 DIAGNOSIS — I10 Essential (primary) hypertension: Secondary | ICD-10-CM

## 2021-03-13 NOTE — Progress Notes (Signed)
MyChart Video Visit    Virtual Visit via Video Note   This visit type was conducted due to national recommendations for restrictions regarding the COVID-19 Pandemic (e.g. social distancing) in an effort to limit this patient's exposure and mitigate transmission in our community. This patient is at least at moderate risk for complications without adequate follow up. This format is felt to be most appropriate for this patient at this time. Physical exam was limited by quality of the video and audio technology used for the visit.    Patient location: home Provider location: [place] involved in the visit: patient, provider  I discussed the limitations of evaluation and management by telemedicine and the availability of in person appointments. The patient expressed understanding and agreed to proceed.  Patient: Martha Mendoza   DOB: 1976/10/25   45 y.o. Female  MRN: [id number] Visit Date: 03/13/2021  Today's healthcare provider: Lavon Paganini, MD   Chief Complaint  Patient presents with  . Hypertension  I,Martha Mendoza,acting as a scribe for Lavon Paganini, MD.,have documented all relevant documentation on the behalf of Lavon Paganini, MD,as directed by  Lavon Paganini, MD while in the presence of Lavon Paganini, MD.  Subjective    HPI  Hypertension, follow-up  BP Readings from Last 3 Encounters:  03/13/21 128/88  02/13/21 (!) 151/100  03/16/20 130/75   Wt Readings from Last 3 Encounters:  02/13/21 199 lb 9.6 oz (90.5 kg)  03/16/20 201 lb (91.2 kg)  09/17/19 204 lb (92.5 kg)     She was last seen for hypertension 1 months ago.  BP at that visit was 151/100. Management since that visit includes increased amlodipine to 5mg  daily.  She reports good compliance with treatment. She is not having side effects.  She is following a Regular diet. She is exercising. She does not smoke.  Use of agents associated with hypertension: none.    Outside blood pressures are . Symptoms: No chest pain No chest pressure  No palpitations No syncope  No dyspnea No orthopnea  No paroxysmal nocturnal dyspnea No lower extremity edema   Pertinent labs: Lab Results  Component Value Date   CHOL 193 02/13/2021   HDL 59 02/13/2021   LDLCALC 118 (H) 02/13/2021   TRIG 90 02/13/2021   CHOLHDL 3.3 02/13/2021   Lab Results  Component Value Date   NA 140 02/13/2021   K 3.9 02/13/2021   CREATININE 0.88 02/13/2021   GFRNONAA 80 02/13/2021   GFRAA 92 02/13/2021   GLUCOSE 79 02/13/2021     The 10-year ASCVD risk score Mikey Bussing DC Jr., et al., 2013) is: 2%   ---------------------------------------------------------------------------------------------------     Medications: Outpatient Medications Prior to Visit  Medication Sig  . amLODipine (NORVASC) 5 MG tablet Take 1 tablet (5 mg total) by mouth daily.  . hydrochlorothiazide (HYDRODIURIL) 12.5 MG tablet Take 1 tablet (12.5 mg total) by mouth daily.   No facility-administered medications prior to visit.    Review of Systems - per HPI    Objective    BP 128/88    Physical Exam Constitutional:      General: She is not in acute distress.    Appearance: Normal appearance.  HENT:     Head: Normocephalic.  Pulmonary:     Effort: Pulmonary effort is normal. No respiratory distress.  Neurological:     Mental Status: She is alert. Mental status is at baseline.        Assessment & Plan  Problem List Items Addressed This Visit      Cardiovascular and Mediastinum   Benign essential hypertension - Primary    Improving and now well controlled on home readings Continue current meds Reviewed last metabolic panel F/u at CPE in 3 months as scheduled          Return in about 3 months (around 06/13/2021) for CPE, as scheduled.     I discussed the assessment and treatment plan with the patient. The patient was provided an opportunity to ask questions and all were  answered. The patient agreed with the plan and demonstrated an understanding of the instructions.   The patient was advised to call back or seek an in-person evaluation if the symptoms worsen or if the condition fails to improve as anticipated.  I, Lavon Paganini, MD, have reviewed all documentation for this visit. The documentation on 03/13/21 for the exam, diagnosis, procedures, and orders are all accurate and complete.   Martha Mendoza, Dionne Bucy, MD, MPH [facility]

## 2021-03-13 NOTE — Assessment & Plan Note (Signed)
Improving and now well controlled on home readings Continue current meds Reviewed last metabolic panel F/u at CPE in 3 months as scheduled

## 2021-04-09 ENCOUNTER — Encounter: Payer: Self-pay | Admitting: Family Medicine

## 2021-05-25 ENCOUNTER — Encounter: Payer: Self-pay | Admitting: Family Medicine

## 2021-12-15 ENCOUNTER — Other Ambulatory Visit: Payer: Self-pay | Admitting: Family Medicine

## 2021-12-15 DIAGNOSIS — I10 Essential (primary) hypertension: Secondary | ICD-10-CM

## 2021-12-15 NOTE — Telephone Encounter (Signed)
Requested medication (s) are due for refill today: yes  Requested medication (s) are on the active medication list: yes  Last refill:  01/24/21 #90 1 RF  Future visit scheduled: no- called pt and LM on VM to call office to schedule appt  Notes to clinic:  needs appt   Requested Prescriptions  Pending Prescriptions Disp Refills   amLODipine (NORVASC) 5 MG tablet [Pharmacy Med Name: amLODIPine Besylate 5 MG Oral Tablet] 90 tablet 0    Sig: Take 1 tablet by mouth once daily     Cardiovascular:  Calcium Channel Blockers Failed - 12/15/2021 10:37 AM      Failed - Valid encounter within last 6 months    Recent Outpatient Visits           9 months ago Benign essential hypertension   [place], Dionne Bucy, MD   10 months ago Benign essential hypertension   [facility], Dionne Bucy, MD   1 year ago Benign essential hypertension   [facility] [place], Dionne Bucy, MD   2 years ago Encounter for annual physical exam   [facility] [place], Dionne Bucy, MD   2 years ago Persistent fever   [facility], Dionne Bucy, MD              Passed - Last BP in normal range    BP Readings from Last 1 Encounters:  03/13/21 128/88

## 2021-12-17 ENCOUNTER — Other Ambulatory Visit: Payer: Self-pay | Admitting: Family Medicine

## 2022-10-15 NOTE — Progress Notes (Unsigned)
I,Joseline E Rosas,acting as a scribe for [employer], MD.,have documented all relevant documentation on the behalf of Martha Foster, MD,as directed by  Martha Foster, MD while in the presence of Martha Foster, MD.   Established patient visit   Patient: Martha Mendoza   DOB: Dec 25, 1975   46 y.o. Female  MRN: [id number] Visit Date: 10/16/2022  Today's healthcare provider: [name], MD   Chief Complaint  Patient presents with   Hypertension   Subjective    HPI  Hypertension, follow-up  BP Readings from Last 3 Encounters:  10/16/22 (!) 153/92  03/13/21 128/88  02/13/21 (!) 151/100   Wt Readings from Last 3 Encounters:  10/16/22 197 lb 1.6 oz (89.4 kg)  02/13/21 199 lb 9.6 oz (90.5 kg)  03/16/20 201 lb (91.2 kg)     She was last seen for hypertension 19 months ago.  BP at that visit was 128/88. Management since that visit includes amlodipine '5mg'$  and HCTZ '25mg'$  daily.Reports that she was taken off of HCTZ '25mg'$  at an UC and was placed on Losartan 50 mg daily. Current treatment:  Amlodipine 5 mg daily Losartan 50 mg.  BP has been elevated for ~2 months    She reports excellent compliance with treatment. She is not having side effects.  She is following a Regular diet and due to traveling for work, she often eats out. She reports experiencing a lot of stress as she balances work assignments and caring her her 59 yo daughter and 62 year niece after the passing of her sister She will be going to [employer] for a job assignment at the end of this week for 4 months  She is exercising. She does not smoke.  She has also been having right sided neck pain that is moderate and feels deep inside of the neck  She denies visual disturbance, unilateral weakness, difficulty swallowing or syncope but has felt lightheaded prompting her to make this appt due to BP   Outside blood pressures are  120/70-155/101.  Symptoms: Has  been getting a little lightheaded No chest pain No chest pressure  No palpitations No syncope  No dyspnea No orthopnea  No paroxysmal nocturnal dyspnea No lower extremity edema   Pertinent labs Lab Results  Component Value Date   CHOL 193 02/13/2021   HDL 59 02/13/2021   LDLCALC 118 (H) 02/13/2021   TRIG 90 02/13/2021   CHOLHDL 3.3 02/13/2021   Lab Results  Component Value Date   NA 140 02/13/2021   K 3.9 02/13/2021   CREATININE 0.88 02/13/2021   GFRNONAA 80 02/13/2021   GLUCOSE 79 02/13/2021   TSH 1.350 07/27/2019     The 10-year ASCVD risk score (Arnett DK, et al., 2019) is: 5.2%  ---------------------------------------------------------------------------------------------------  Prediabetes, Follow-up  Lab Results  Component Value Date   HGBA1C 5.8 (H) 02/13/2021   HGBA1C 6.2 (A) 03/16/2020   HGBA1C 6.2 (H) 09/17/2019   GLUCOSE 79 02/13/2021   GLUCOSE 85 03/16/2020   GLUCOSE 99 07/27/2019    Last seen for for this 20 months ago.  Management since that visit includes recommended low carb diet and regular exercise.   Pertinent Labs:    Component Value Date/Time   CHOL 193 02/13/2021 1406   TRIG 90 02/13/2021 1406   CHOLHDL 3.3 02/13/2021 1406   CREATININE 0.88 02/13/2021 1406    Wt Readings from Last 3 Encounters:  10/16/22 197 lb 1.6 oz (89.4 kg)  02/13/21 199 lb 9.6 oz (90.5 kg)  03/16/20 201 lb (91.2 kg)    -----------------------------------------------------------------------------------------  Medications: Outpatient Medications Prior to Visit  Medication Sig   amLODipine (NORVASC) 5 MG tablet Take 1 tablet (5 mg total) by mouth daily.   [DISCONTINUED] hydrochlorothiazide (HYDRODIURIL) 12.5 MG tablet Take 1 tablet (12.5 mg total) by mouth daily. Please schedule office visit before any future refills.   No facility-administered medications prior to visit.    Review of Systems     Objective    BP (!) 153/92 (BP Location: Left Arm, Patient  Position: Sitting, Cuff Size: Large)   Pulse 76   Temp 98.3 F (36.8 C) (Oral)   Resp 16   Ht '5\' 1"'$  (1.549 m)   Wt 197 lb 1.6 oz (89.4 kg)   BMI 37.24 kg/m    Physical Exam Cardiovascular:     Rate and Rhythm: Normal rate and regular rhythm.     Chest Wall: No thrill.     Pulses: Normal pulses.          Carotid pulses are 2+ on the right side and 2+ on the left side.      Radial pulses are 2+ on the right side and 2+ on the left side.     Heart sounds: Normal heart sounds. Heart sounds not distant. No murmur heard.    No friction rub. No gallop.     Comments: No bruit of carotids  Pulmonary:     Effort: Pulmonary effort is normal. No respiratory distress.     Breath sounds: No wheezing.  Musculoskeletal:     Right lower leg: No edema.     Left lower leg: No edema.       No results found for any visits on 10/16/22.  Assessment & Plan     Problem List Items Addressed This Visit       Cardiovascular and Mediastinum   Benign essential hypertension - Primary    Blood pressure elevated, uncontrolled She will continue amlodipine 5 mg daily We will discontinue hydrochlorothiazide 12.5 mg We will add losartan 100 mg-hydrochlorothiazide 25 mg [place], we will have her follow-up next week for virtual visit for blood pressure check She is agreeable to CMP today Given report of neck pain and persistently elevated blood pressures, concern for some degree of carotid pathology and will order ultrasound to be completed optimally before she leaves for her work assignment       Relevant Medications   losartan-hydrochlorothiazide (HYZAAR) 100-25 MG tablet   Other Relevant Orders   Comprehensive metabolic panel     Other   Neck pain without injury   Relevant Orders   VAS [place]     Return in about 6 days (around 10/22/2022) for Elevated blood pressure.       MEulis Foster MD  [phone](phone) [phone](fax)  [place]

## 2022-10-16 ENCOUNTER — Ambulatory Visit (INDEPENDENT_AMBULATORY_CARE_PROVIDER_SITE_OTHER): Payer: BC Managed Care – PPO | Admitting: Family Medicine

## 2022-10-16 ENCOUNTER — Encounter: Payer: Self-pay | Admitting: Family Medicine

## 2022-10-16 VITALS — BP 185/101 | HR 76 | Temp 98.3°F | Resp 16 | Ht 61.0 in | Wt 197.1 lb

## 2022-10-16 DIAGNOSIS — M542 Cervicalgia: Secondary | ICD-10-CM | POA: Diagnosis not present

## 2022-10-16 DIAGNOSIS — I10 Essential (primary) hypertension: Secondary | ICD-10-CM

## 2022-10-16 MED ORDER — LOSARTAN POTASSIUM-HCTZ 100-25 MG PO TABS: 1.0000 | ORAL_TABLET | Freq: Every day | ORAL | 2 refills | Status: DC

## 2022-10-16 NOTE — Assessment & Plan Note (Signed)
Blood pressure elevated, uncontrolled She will continue amlodipine 5 mg daily We will discontinue hydrochlorothiazide 12.5 mg We will add losartan 100 mg-hydrochlorothiazide 25 mg Patient leaving for [place]ignment [place], we will have her follow-up next week for virtual visit for blood pressure check She is agreeable to CMP today Given report of neck pain and persistently elevated blood pressures, concern for some degree of carotid pathology and will order ultrasound to be completed optimally before she leaves for her work assignment

## 2022-10-16 NOTE — Patient Instructions (Signed)
I have adjusted your medications to include losartan-hydrochlorothiazide combination pill as well as for you to continue your amlodipine 5 mg daily.  This new pill includes an increased dose of the losartan that you have already been taking and aspect hydrochlorothiazide that you have taken in the past.  We have also ordered a stat ultrasound of your neck to evaluate the vessels in relation to your neck pain.  Please plan to follow-up with [place] in the next week via virtual appointment to check on your blood pressure.  In the meantime, please check your blood pressure 1 to 2 hours after taking your blood pressure medications.  We will be important to be seen immediately if you have one-sided weakness, dizziness, any episodes of passing out, slurring speech, or confusion if these are signs of a stroke and need to be immediately evaluated.  I recommend scheduling follow-up with [place] soon after you return from your work assignment.

## 2022-10-17 ENCOUNTER — Telehealth: Payer: Self-pay | Admitting: Family Medicine

## 2022-10-17 ENCOUNTER — Ambulatory Visit
Admission: RE | Admit: 2022-10-17 | Discharge: 2022-10-17 | Disposition: A | Discharge status: Home / Self Care | Payer: BC Managed Care – PPO | Source: Ambulatory Visit | Attending: Family Medicine | Admitting: Family Medicine

## 2022-10-17 DIAGNOSIS — R519 Headache, unspecified: Secondary | ICD-10-CM | POA: Diagnosis not present

## 2022-10-17 DIAGNOSIS — R42 Dizziness and giddiness: Secondary | ICD-10-CM | POA: Diagnosis not present

## 2022-10-17 DIAGNOSIS — R030 Elevated blood-pressure reading, without diagnosis of hypertension: Secondary | ICD-10-CM | POA: Diagnosis not present

## 2022-10-17 DIAGNOSIS — M542 Cervicalgia: Secondary | ICD-10-CM | POA: Insufficient documentation

## 2022-10-17 DIAGNOSIS — I7771 Dissection of carotid artery: Secondary | ICD-10-CM | POA: Diagnosis not present

## 2022-10-17 DIAGNOSIS — Z888 Allergy status to other drugs, medicaments and biological substances status: Secondary | ICD-10-CM | POA: Diagnosis not present

## 2022-10-17 DIAGNOSIS — R930 Abnormal findings on diagnostic imaging of skull and head, not elsewhere classified: Secondary | ICD-10-CM | POA: Diagnosis not present

## 2022-10-17 DIAGNOSIS — I10 Essential (primary) hypertension: Secondary | ICD-10-CM | POA: Diagnosis not present

## 2022-10-17 DIAGNOSIS — R079 Chest pain, unspecified: Secondary | ICD-10-CM | POA: Diagnosis not present

## 2022-10-17 LAB — COMPREHENSIVE METABOLIC PANEL
ALT: 8 IU/L (ref 0–32)
AST: 12 IU/L (ref 0–40)
Albumin/Globulin Ratio: 1.1 — ABNORMAL LOW (ref 1.2–2.2)
Albumin: 4.2 g/dL (ref 3.9–4.9)
Alkaline Phosphatase: 76 IU/L (ref 44–121)
BUN/Creatinine Ratio: 13 (ref 9–23)
BUN: 11 mg/dL (ref 6–24)
Bilirubin Total: <0.2 mg/dL (ref 0.0–1.2)
CO2: 23 mmol/L (ref 20–29)
Calcium: 9.6 mg/dL (ref 8.7–10.2)
Chloride: 103 mmol/L (ref 96–106)
Creatinine, Ser: 0.88 mg/dL (ref 0.57–1.00)
Globulin, Total: 3.8 g/dL (ref 1.5–4.5)
Glucose: 128 mg/dL — ABNORMAL HIGH (ref 70–99)
Potassium: 4 mmol/L (ref 3.5–5.2)
Sodium: 137 mmol/L (ref 134–144)
Total Protein: 8 g/dL (ref 6.0–8.5)
eGFR: 82 mL/min/{1.73_m2} (ref >59)

## 2022-10-17 NOTE — Telephone Encounter (Signed)
Contacted patient to review critical results of carotid ultrasound for right sided neck pain in setting of elevated BP.   Spoke with Dr. Waymon Budge, radiology, who relayed concern for visualization of possible dissection flap and recommended ED evaluation for STAT CTA or the neck for Martha Mendoza.   Contacted the patient inform of her results. Questions were answered. Patient was agreeable to going to nearest ED for evaluation and recommended imaging for concern of R carotid dissection in setting of HTN and right neck pain. Of note, patient reports feeling well and plans to travel to ED now. Emphasized importance of not delaying evaluation with understanding patient was planning to travel to [place] state for upcoming 4 month work assignment.    [name], MD  [facility]  [phone]

## 2022-10-18 DIAGNOSIS — M542 Cervicalgia: Secondary | ICD-10-CM | POA: Diagnosis not present

## 2022-10-23 ENCOUNTER — Telehealth: Payer: Self-pay

## 2022-10-23 NOTE — Progress Notes (Unsigned)
      Established patient visit   Patient: Martha Mendoza   DOB: [birth year]   46 y.o. Female  MRN: [id number] Visit Date: 10/24/2022  Today's healthcare provider: Lavon Paganini, MD   No chief complaint on file.  Subjective    HPI  Hypertension: Patient here for follow-up of elevated blood pressure. She is exercising and is not adherent to low salt diet.  Blood pressure {is/is not:9024} well controlled at home. Cardiac symptoms {Symptoms; cardiac:12860}. Patient denies {Symptoms; cardiac:12860}.  Cardiovascular risk factors: {cv risk factors:510}. Use of agents associated with hypertension: {bp agents assoc with hypertension:511::"none"}. History of target organ damage: {target [phone]}. Patient advised to continue Amlodipine 5 mg daily. D/C HCTZ 12.5 mg. Add losartan-HCTZ 100-25 mg. VAS US carotid ordered. Patient was contacted and advised to go to ED for evaluation for possible R carotid dissection.   Follow up ER visit  Patient was seen in ER for neck pain on 10/17/22. Per ED note no acute vascular surgery indications No indications for anti-PLT from surgical perspective  -----------------------------------------------------------------------------------------  Medications: Outpatient Medications Prior to Visit  Medication Sig   amLODipine (NORVASC) 5 MG tablet Take 1 tablet (5 mg total) by mouth daily.   losartan-hydrochlorothiazide (HYZAAR) 100-25 MG tablet Take 1 tablet by mouth daily.   No facility-administered medications prior to visit.    Review of Systems  {Labs  Heme  Chem  Endocrine  Serology  Results Review (optional):23779}   Objective    There were no vitals taken for this visit. BP Readings from Last 3 Encounters:  10/16/22 (!) 185/101  03/13/21 128/88  02/13/21 (!) 151/100   Wt Readings from Last 3 Encounters:  10/16/22 197 lb 1.6 oz (89.4 kg)  02/13/21 199 lb 9.6 oz (90.5 kg)  03/16/20 201 lb (91.2 kg)      Physical Exam  ***  No  results found for any visits on 10/24/22.  Assessment & Plan     ***  No follow-ups on file.      {provider attestation***:1}   Lavon Paganini, MD  [facility] [phone] (phone) [phone] (fax)  [place]

## 2022-10-23 NOTE — Telephone Encounter (Signed)
Copied from [place] [id number]. Topic: Appointment Scheduling - Scheduling Inquiry for Clinic >> Oct 23, 2022 12:21 PM Martha Mendoza wrote: Reason for CRM: The patient has called to request that their appt for tomorrow 10/24/22 be changed to virtual due to them being out of town for travel   Please contact further when possible

## 2022-10-24 ENCOUNTER — Telehealth (INDEPENDENT_AMBULATORY_CARE_PROVIDER_SITE_OTHER): Payer: BC Managed Care – PPO | Admitting: Family Medicine

## 2022-10-24 ENCOUNTER — Encounter: Payer: Self-pay | Admitting: Family Medicine

## 2022-10-24 VITALS — BP 130/78 | HR 80 | Temp 98.5°F | Resp 16 | Wt 194.0 lb

## 2022-10-24 DIAGNOSIS — I10 Essential (primary) hypertension: Secondary | ICD-10-CM | POA: Diagnosis not present

## 2022-10-24 DIAGNOSIS — E559 Vitamin D deficiency, unspecified: Secondary | ICD-10-CM | POA: Diagnosis not present

## 2022-10-24 DIAGNOSIS — E669 Obesity, unspecified: Secondary | ICD-10-CM

## 2022-10-24 DIAGNOSIS — Z6836 Body mass index (BMI) 36.0-36.9, adult: Secondary | ICD-10-CM | POA: Diagnosis not present

## 2022-10-24 DIAGNOSIS — R7303 Prediabetes: Secondary | ICD-10-CM | POA: Diagnosis not present

## 2022-10-24 MED ORDER — AMLODIPINE BESYLATE 5 MG PO TABS: 5.0000 mg | ORAL_TABLET | Freq: Every day | ORAL | 1 refills | Status: DC

## 2022-10-24 NOTE — Assessment & Plan Note (Signed)
Recommend low carb diet °Recheck A1c  °

## 2022-10-24 NOTE — Telephone Encounter (Signed)
Is this okay?

## 2022-10-24 NOTE — Assessment & Plan Note (Signed)
Continue supplement Recheck level 

## 2022-10-24 NOTE — Progress Notes (Signed)
I,Joseline E Rosas,acting as a scribe for Lavon Paganini, MD.,have documented all relevant documentation on the behalf of Lavon Paganini, MD,as directed by  Lavon Paganini, MD while in the presence of Lavon Paganini, MD.     Established patient visit   Patient: Martha Mendoza   DOB: 1976-05-30   46 y.o. Female  MRN: [id number] Visit Date: 10/24/2022  Today's healthcare provider: Lavon Paganini, MD   Chief Complaint  Patient presents with   Hypertension   Subjective    HPI  Hypertension, follow-up  BP Readings from Last 3 Encounters:  10/24/22 130/78  10/16/22 (!) 185/101  03/13/21 128/88   Wt Readings from Last 3 Encounters:  10/24/22 194 lb (88 kg)  10/16/22 197 lb 1.6 oz (89.4 kg)  02/13/21 199 lb 9.6 oz (90.5 kg)     She was last seen for hypertension 6 days ago.  BP at that visit was 185/101. Management since that visit includes continue Amlodipine 5 mg and Losartan-Hydrochlorothiazide 100-26m.  She reports excellent compliance with treatment. She is not having side effects.  She does not smoke.   Outside blood pressures are 120's/70's. Symptoms:Reports feeling more fatigue and neck pain comes and goes. No chest pain No chest pressure  No palpitations No syncope  No dyspnea No orthopnea  No paroxysmal nocturnal dyspnea No lower extremity edema   Pertinent labs Lab Results  Component Value Date   CHOL 193 02/13/2021   HDL 59 02/13/2021   LDLCALC 118 (H) 02/13/2021   TRIG 90 02/13/2021   CHOLHDL 3.3 02/13/2021   Lab Results  Component Value Date   NA 137 10/16/2022   K 4.0 10/16/2022   CREATININE 0.88 10/16/2022   EGFR 82 10/16/2022   GLUCOSE 128 (H) 10/16/2022   TSH 1.350 07/27/2019     The 10-year ASCVD risk score (Arnett DK, et al., 2019) is: 2.5%  ---------------------------------------------------------------------------------------------------  Home bp's 123/67, 135/82, 129/71. Medications: Outpatient Medications Prior to  Visit  Medication Sig   losartan-hydrochlorothiazide (HYZAAR) 100-25 MG tablet Take 1 tablet by mouth daily.   [DISCONTINUED] amLODipine (NORVASC) 5 MG tablet Take 1 tablet (5 mg total) by mouth daily.   No facility-administered medications prior to visit.    Review of Systems per HPI     Objective    BP 130/78 (BP Location: Left Arm, Patient Position: Sitting, Cuff Size: Large)   Pulse 80   Temp 98.5 F (36.9 C) (Temporal)   Resp 16   Wt 194 lb (88 kg)   SpO2 99%   BMI 36.66 kg/m    Physical Exam Vitals reviewed.  Constitutional:      General: She is not in acute distress.    Appearance: Normal appearance. She is well-developed. She is not diaphoretic.  HENT:     Head: Normocephalic and atraumatic.  Eyes:     General: No scleral icterus.    Conjunctiva/sclera: Conjunctivae normal.  Neck:     Thyroid: No thyromegaly.  Cardiovascular:     Rate and Rhythm: Normal rate and regular rhythm.     Pulses: Normal pulses.     Heart sounds: Normal heart sounds. No murmur heard. Pulmonary:     Effort: Pulmonary effort is normal. No respiratory distress.     Breath sounds: Normal breath sounds. No wheezing, rhonchi or rales.  Musculoskeletal:     Cervical back: Neck supple.     Right lower leg: No edema.     Left lower leg: No edema.  Lymphadenopathy:  Cervical: No cervical adenopathy.  Skin:    General: Skin is warm and dry.     Findings: No rash.  Neurological:     Mental Status: She is alert and oriented to person, place, and time. Mental status is at baseline.  Psychiatric:        Mood and Affect: Mood normal.        Behavior: Behavior normal.       No results found for any visits on 10/24/22.  Assessment & Plan     Problem List Items Addressed This Visit       Cardiovascular and Mediastinum   Benign essential hypertension - Primary    Well controlled on manual recheck Continue to monitor home BPs Continue current medications Reviewed recent  metabolic panel F/u in 6 months       Relevant Medications   amLODipine (NORVASC) 5 MG tablet     Other   Vitamin D deficiency    Continue supplement Recheck level       Relevant Orders   VITAMIN D 25 Hydroxy (Vit-D Deficiency, Fractures)   Obesity    Discussed importance of healthy weight management Discussed diet and exercise       Relevant Orders   Lipid panel   Pre-diabetes    Recommend low carb diet Recheck A1c       Relevant Orders   Hemoglobin A1c     Return in about 6 months (around 04/24/2023) for CPE.      I, Lavon Paganini, MD, have reviewed all documentation for this visit. The documentation on 10/24/22 for the exam, diagnosis, procedures, and orders are all accurate and complete.   Bronda Alfred, Dionne Bucy, MD, MPH [facility]

## 2022-10-24 NOTE — Assessment & Plan Note (Signed)
Well controlled on manual recheck Continue to monitor home BPs Continue current medications Reviewed recent metabolic panel F/u in 6 months

## 2022-10-24 NOTE — Assessment & Plan Note (Signed)
Discussed importance of healthy weight management Discussed diet and exercise  

## 2022-10-25 LAB — LIPID PANEL
Chol/HDL Ratio: 2.9 ratio (ref 0.0–4.4)
Cholesterol, Total: 182 mg/dL (ref 100–199)
HDL: 63 mg/dL (ref >39)
LDL Chol Calc (NIH): 108 mg/dL — ABNORMAL HIGH (ref 0–99)
Triglycerides: 59 mg/dL (ref 0–149)
VLDL Cholesterol Cal: 11 mg/dL (ref 5–40)

## 2022-10-25 LAB — HEMOGLOBIN A1C
Est. average glucose Bld gHb Est-mCnc: 134 mg/dL
Hgb A1c MFr Bld: 6.3 % — ABNORMAL HIGH (ref 4.8–5.6)

## 2022-10-25 LAB — VITAMIN D 25 HYDROXY (VIT D DEFICIENCY, FRACTURES): Vit D, 25-Hydroxy: 21.9 ng/mL — ABNORMAL LOW (ref 30.0–100.0)

## 2023-04-25 ENCOUNTER — Encounter: Payer: BC Managed Care – PPO | Admitting: Family Medicine

## 2023-07-05 ENCOUNTER — Other Ambulatory Visit: Payer: Self-pay | Admitting: Family Medicine

## 2023-07-05 DIAGNOSIS — I10 Essential (primary) hypertension: Secondary | ICD-10-CM

## 2023-09-23 NOTE — Progress Notes (Unsigned)
      Established patient visit   Patient: Martha Mendoza   DOB: May 08, 1976   47 y.o. Female  MRN: [id number] Visit Date: 09/24/2023  Today's healthcare provider: Ronnald Ramp, MD   No chief complaint on file.  Subjective       Discussed the use of AI scribe software for clinical note transcription with the patient, who gave verbal consent to proceed.  History of Present Illness             Past Medical History:  Diagnosis Date   Fever blister    Hypertension    Iron deficiency anemia    Vitamin D deficiency     Medications: Outpatient Medications Prior to Visit  Medication Sig   amLODipine (NORVASC) 5 MG tablet Take 1 tablet (5 mg total) by mouth daily.   losartan-hydrochlorothiazide (HYZAAR) 100-25 MG tablet Take 1 tablet by mouth once daily   No facility-administered medications prior to visit.    Review of Systems  Last metabolic panel Lab Results  Component Value Date   GLUCOSE 128 (H) 10/16/2022   NA 137 10/16/2022   K 4.0 10/16/2022   CL 103 10/16/2022   CO2 23 10/16/2022   BUN 11 10/16/2022   CREATININE 0.88 10/16/2022   EGFR 82 10/16/2022   CALCIUM 9.6 10/16/2022   PROT 8.0 10/16/2022   ALBUMIN 4.2 10/16/2022   LABGLOB 3.8 10/16/2022   AGRATIO 1.1 (L) 10/16/2022   BILITOT <0.2 10/16/2022   ALKPHOS 76 10/16/2022   AST 12 10/16/2022   ALT 8 10/16/2022   ANIONGAP 8 04/18/2018     {See past labs  Heme  Chem  Endocrine  Serology  Results Review (optional):1}   Objective    There were no vitals taken for this visit. BP Readings from Last 3 Encounters:  10/24/22 130/78  10/16/22 (!) 185/101  03/13/21 128/88   Wt Readings from Last 3 Encounters:  10/24/22 194 lb (88 kg)  10/16/22 197 lb 1.6 oz (89.4 kg)  02/13/21 199 lb 9.6 oz (90.5 kg)    {See vitals history (optional):1}   Physical Exam  ***  No results found for any visits on 09/24/23.  Assessment & Plan     Problem List Items Addressed This Visit    None   Assessment and Plan              No follow-ups on file.         Ronnald Ramp, MD  [facility] [phone] (phone) [phone] (fax)  [facility]

## 2023-09-24 ENCOUNTER — Ambulatory Visit (INDEPENDENT_AMBULATORY_CARE_PROVIDER_SITE_OTHER): Payer: BC Managed Care – PPO | Admitting: Family Medicine

## 2023-09-24 VITALS — BP 150/98 | HR 73 | Ht 61.0 in | Wt 202.0 lb

## 2023-09-24 DIAGNOSIS — E66812 Obesity, class 2: Secondary | ICD-10-CM | POA: Diagnosis not present

## 2023-09-24 DIAGNOSIS — R7303 Prediabetes: Secondary | ICD-10-CM | POA: Diagnosis not present

## 2023-09-24 DIAGNOSIS — I10 Essential (primary) hypertension: Secondary | ICD-10-CM | POA: Diagnosis not present

## 2023-09-24 DIAGNOSIS — D508 Other iron deficiency anemias: Secondary | ICD-10-CM | POA: Diagnosis not present

## 2023-09-24 DIAGNOSIS — Z1211 Encounter for screening for malignant neoplasm of colon: Secondary | ICD-10-CM

## 2023-09-24 DIAGNOSIS — E559 Vitamin D deficiency, unspecified: Secondary | ICD-10-CM | POA: Diagnosis not present

## 2023-09-24 DIAGNOSIS — Z6836 Body mass index (BMI) 36.0-36.9, adult: Secondary | ICD-10-CM

## 2023-09-24 MED ORDER — AMLODIPINE BESYLATE 10 MG PO TABS: 10.0000 mg | ORAL_TABLET | Freq: Every day | ORAL | 1 refills | Status: DC

## 2023-09-25 ENCOUNTER — Other Ambulatory Visit: Payer: Self-pay | Admitting: Family Medicine

## 2023-09-25 DIAGNOSIS — I10 Essential (primary) hypertension: Secondary | ICD-10-CM

## 2023-09-25 LAB — COMPREHENSIVE METABOLIC PANEL
ALT: 12 [IU]/L (ref 0–32)
AST: 18 [IU]/L (ref 0–40)
Albumin: 4 g/dL (ref 3.9–4.9)
Alkaline Phosphatase: 78 [IU]/L (ref 44–121)
BUN/Creatinine Ratio: 7 — ABNORMAL LOW (ref 9–23)
BUN: 7 mg/dL (ref 6–24)
Bilirubin Total: 0.3 mg/dL (ref 0.0–1.2)
CO2: 24 mmol/L (ref 20–29)
Calcium: 9.5 mg/dL (ref 8.7–10.2)
Chloride: 103 mmol/L (ref 96–106)
Creatinine, Ser: 0.97 mg/dL (ref 0.57–1.00)
Globulin, Total: 4.1 g/dL (ref 1.5–4.5)
Glucose: 90 mg/dL (ref 70–99)
Potassium: 4.2 mmol/L (ref 3.5–5.2)
Sodium: 139 mmol/L (ref 134–144)
Total Protein: 8.1 g/dL (ref 6.0–8.5)
eGFR: 72 mL/min/{1.73_m2} (ref >59)

## 2023-09-25 LAB — CBC
Hematocrit: 38.8 % (ref 34.0–46.6)
Hemoglobin: 11.3 g/dL (ref 11.1–15.9)
MCH: 19.2 pg — ABNORMAL LOW (ref 26.6–33.0)
MCHC: 29.1 g/dL — ABNORMAL LOW (ref 31.5–35.7)
MCV: 66 fL — ABNORMAL LOW (ref 79–97)
Platelets: 386 10*3/uL (ref 150–450)
RBC: 5.89 x10E6/uL — ABNORMAL HIGH (ref 3.77–5.28)
RDW: 17.7 % — ABNORMAL HIGH (ref 11.7–15.4)
WBC: 6 10*3/uL (ref 3.4–10.8)

## 2023-09-25 LAB — VITAMIN D 25 HYDROXY (VIT D DEFICIENCY, FRACTURES): Vit D, 25-Hydroxy: 26.4 ng/mL — ABNORMAL LOW (ref 30.0–100.0)

## 2023-09-25 MED ORDER — LOSARTAN POTASSIUM-HCTZ 100-25 MG PO TABS: 1.0000 | ORAL_TABLET | Freq: Every day | ORAL | 0 refills | Status: DC

## 2023-09-25 NOTE — Assessment & Plan Note (Addendum)
History of anemia. Chronic,stable without signs of bleeding on exam, nor per hx  -Check CBC today.

## 2023-09-25 NOTE — Assessment & Plan Note (Signed)
Patient reports previous low levels, but currently not on supplementation. -Check Vitamin D level today.

## 2023-09-25 NOTE — Assessment & Plan Note (Signed)
Despite adherence to Losartan 100mg /Hydrochlorothiazide 25mg , blood pressure remains elevated (150/98). Patient reports home readings sometimes as low as 116/75. Patient has a history of headaches with Hydrochlorothiazide, but currently tolerating the medication well. -Add Amlodipine 5mg  daily to current regimen. continue losartan-hctz 100-25mg  -If blood pressure readings remain >140/90, increase Amlodipine to 10mg  daily. -Encourage patient to establish care with a local provider while traveling for work to monitor blood pressure and make necessary medication adjustments.

## 2023-10-02 ENCOUNTER — Encounter: Payer: Self-pay | Admitting: *Deleted

## 2024-01-20 ENCOUNTER — Other Ambulatory Visit: Payer: Self-pay | Admitting: Family Medicine

## 2024-01-20 DIAGNOSIS — I10 Essential (primary) hypertension: Secondary | ICD-10-CM

## 2024-01-22 ENCOUNTER — Telehealth (INDEPENDENT_AMBULATORY_CARE_PROVIDER_SITE_OTHER): Payer: BC Managed Care – PPO | Admitting: Family Medicine

## 2024-01-22 ENCOUNTER — Encounter: Payer: Self-pay | Admitting: Family Medicine

## 2024-01-22 VITALS — BP 138/78

## 2024-01-22 DIAGNOSIS — I10 Essential (primary) hypertension: Secondary | ICD-10-CM | POA: Diagnosis not present

## 2024-01-22 DIAGNOSIS — Z1231 Encounter for screening mammogram for malignant neoplasm of breast: Secondary | ICD-10-CM

## 2024-01-22 MED ORDER — LOSARTAN POTASSIUM-HCTZ 100-25 MG PO TABS: 1.0000 | ORAL_TABLET | Freq: Every day | ORAL | 1 refills | Status: DC

## 2024-01-22 MED ORDER — METOPROLOL SUCCINATE ER 25 MG PO TB24: 25.0000 mg | ORAL_TABLET | Freq: Every day | ORAL | 1 refills | Status: DC

## 2024-01-22 NOTE — Patient Instructions (Signed)
Call [facility] to schedule a mammogram [phone]

## 2024-01-22 NOTE — Progress Notes (Signed)
MyChart Video Visit    Virtual Visit via Video Note   This format is felt to be most appropriate for this patient at this time. Physical exam was limited by quality of the video and audio technology used for the visit.    Patient location: home Provider location: [facility] Persons involved in the visit: patient, provider  I discussed the limitations of evaluation and management by telemedicine and the availability of in person appointments. The patient expressed understanding and agreed to proceed.  Patient: Martha Mendoza   DOB: 1975-12-24   48 y.o. Female  MRN: [id number] Visit Date: 01/22/2024  Today's healthcare provider: Shirlee Latch, MD   No chief complaint on file.  Subjective    HPI   Discussed the use of AI scribe software for clinical note transcription with the patient, who gave verbal consent to proceed.  History of Present Illness   The patient, with a history of hypertension, presents with joint pain that they attribute to amlodipine, a medication they are currently taking. The patient reports that the pain is severe enough to cause them to double over in the morning. The pain is primarily in the joints, and the patient reports that their right knee sometimes swells, particularly after walking a lot at work. The patient does not believe the amlodipine is causing any swelling. The patient also reports that their blood pressure readings have been elevated, with a recent reading of 156/96. However, the patient also reports a reading of 138/78 earlier in the day. The patient has a history of taking lisinopril, which caused a cough, and labetalol, which was taken temporarily after pregnancy. The patient also mentions a need for a physical exam, a Pap smear, and a mammogram.         Review of Systems      Objective    BP 138/78 Comment: home readings      Physical Exam Constitutional:      General: She is not in acute distress.     Appearance: Normal appearance.  HENT:     Head: Normocephalic.  Pulmonary:     Effort: Pulmonary effort is normal. No respiratory distress.  Neurological:     Mental Status: She is alert and oriented to person, place, and time. Mental status is at baseline.        Assessment & Plan     Problem List Items Addressed This Visit       Cardiovascular and Mediastinum   Benign essential hypertension - Primary   Hypertension managed with losartan HCTZ and amlodipine. Reports joint pain and occasional knee swelling, likely due to amlodipine. Blood pressure readings today were 156/96 mmHg and 138/78 mmHg. Previous medications include lisinopril (caused cough) and labetalol (used post-pregnancy). Discussed switching from amlodipine to metoprolol due to side effects. Explained that metoprolol may lower heart rate, but current heart rate is high, providing a buffer. Informed that if heart rate drops below 60 bpm, it may cause fatigue. No other significant risks anticipated. - Discontinue amlodipine - Continue losartan HCTZ 100/25 mg - Start metoprolol 25 mg daily - Follow up in one month to assess blood pressure control and medication tolerance      Relevant Medications   losartan-hydrochlorothiazide (HYZAAR) 100-25 MG tablet   metoprolol succinate (TOPROL-XL) 25 MG 24 hr tablet   Other Visit Diagnoses       Breast cancer screening by mammogram       Relevant Orders   MM 3D SCREENING MAMMOGRAM BILATERAL  BREAST           General Health Maintenance Overdue for physical exam and cancer screenings. Last physical in 2020, last Pap smear in 2016. Discussed importance of regular screenings including mammogram and colon screening. Prefers to schedule these when in town to avoid fragmented records. - Schedule physical exam in June - Order mammogram - Schedule Pap smear during physical exam - Provide phone number for mammogram scheduling via MyChart  Follow-up - Follow up virtually in one  month.         Meds ordered this encounter  Medications   losartan-hydrochlorothiazide (HYZAAR) 100-25 MG tablet    Sig: Take 1 tablet by mouth daily.    Dispense:  90 tablet    Refill:  1   metoprolol succinate (TOPROL-XL) 25 MG 24 hr tablet    Sig: Take 1 tablet (25 mg total) by mouth daily.    Dispense:  90 tablet    Refill:  1     Return in about 4 weeks (around 02/19/2024) for BP f/u, virtual ok.     I discussed the assessment and treatment plan with the patient. The patient was provided an opportunity to ask questions and all were answered. The patient agreed with the plan and demonstrated an understanding of the instructions.   The patient was advised to call back or seek an in-person evaluation if the symptoms worsen or if the condition fails to improve as anticipated.   Shirlee Latch, MD [facility] [phone] (phone) [phone] (fax)  [facility]

## 2024-01-22 NOTE — Assessment & Plan Note (Signed)
Hypertension managed with losartan HCTZ and amlodipine. Reports joint pain and occasional knee swelling, likely due to amlodipine. Blood pressure readings today were 156/96 mmHg and 138/78 mmHg. Previous medications include lisinopril (caused cough) and labetalol (used post-pregnancy). Discussed switching from amlodipine to metoprolol due to side effects. Explained that metoprolol may lower heart rate, but current heart rate is high, providing a buffer. Informed that if heart rate drops below 60 bpm, it may cause fatigue. No other significant risks anticipated. - Discontinue amlodipine - Continue losartan HCTZ 100/25 mg - Start metoprolol 25 mg daily - Follow up in one month to assess blood pressure control and medication tolerance

## 2024-03-18 ENCOUNTER — Telehealth: Payer: Self-pay

## 2024-03-18 DIAGNOSIS — Z1211 Encounter for screening for malignant neoplasm of colon: Secondary | ICD-10-CM

## 2024-03-18 NOTE — Telephone Encounter (Unsigned)
 Copied from CRM [id number]. Topic: Clinical - Request for Lab/Test Order >> Mar 18, 2024  3:47 PM Ivette P wrote: Reason for CRM: pt is requesting an order for colonoscopy. Per tele visit on 01/30 was told by primary

## 2024-03-19 NOTE — Telephone Encounter (Signed)
 Referral placed.

## 2024-03-24 ENCOUNTER — Other Ambulatory Visit: Payer: Self-pay

## 2024-03-24 ENCOUNTER — Telehealth: Payer: Self-pay

## 2024-03-24 DIAGNOSIS — Z1211 Encounter for screening for malignant neoplasm of colon: Secondary | ICD-10-CM

## 2024-03-24 MED ORDER — NA SULFATE-K SULFATE-MG SULF 17.5-3.13-1.6 GM/177ML PO SOLN: 1.0000 | Freq: Once | ORAL | 0 refills | Status: AC

## 2024-03-24 NOTE — Telephone Encounter (Signed)
 Gastroenterology Pre-Procedure Review  Request Date: 04/01/24 Requesting Physician: Dr. Tobi Bastos  PATIENT REVIEW QUESTIONS: The patient responded to the following health history questions as indicated:    1. Are you having any GI issues? no 2. Do you have a personal history of Polyps? no 3. Do you have a family history of Colon Cancer or Polyps? no 4. Diabetes Mellitus? no 5. Joint replacements in the past 12 months?no 6. Major health problems in the past 3 months?no 7. Any artificial heart valves, MVP, or defibrillator?no    MEDICATIONS & ALLERGIES:    Patient reports the following regarding taking any anticoagulation/antiplatelet therapy:   Plavix, Coumadin, Eliquis, Xarelto, Lovenox, Pradaxa, Brilinta, or Effient? no Aspirin? no  Patient confirms/reports the following medications:  Current Outpatient Medications  Medication Sig Dispense Refill   losartan-hydrochlorothiazide (HYZAAR) 100-25 MG tablet Take 1 tablet by mouth daily. 90 tablet 1   metoprolol succinate (TOPROL-XL) 25 MG 24 hr tablet Take 1 tablet (25 mg total) by mouth daily. 90 tablet 1   No current facility-administered medications for this visit.    Patient confirms/reports the following allergies:  Allergies  Allergen Reactions   Lisinopril Swelling    angioedema    No orders of the defined types were placed in this encounter.   AUTHORIZATION INFORMATION Primary Insurance: 1D#: Group #:  Secondary Insurance: 1D#: Group #:  SCHEDULE INFORMATION: Date: 04/01/24 Time: Location: [facility]

## 2024-03-25 ENCOUNTER — Encounter: Payer: Self-pay | Admitting: Gastroenterology

## 2024-03-31 ENCOUNTER — Encounter: Payer: Self-pay | Admitting: Gastroenterology

## 2024-04-01 ENCOUNTER — Encounter: Payer: Self-pay | Admitting: Gastroenterology

## 2024-04-01 ENCOUNTER — Ambulatory Visit: Payer: Self-pay | Admitting: Anesthesiology

## 2024-04-01 ENCOUNTER — Ambulatory Visit
Admission: RE | Admit: 2024-04-01 | Discharge: 2024-04-01 | Disposition: A | Discharge status: Home / Self Care | Attending: Gastroenterology | Admitting: Gastroenterology

## 2024-04-01 ENCOUNTER — Encounter
Admission: RE | Disposition: A | Discharge status: Home / Self Care | Payer: Self-pay | Source: Home / Self Care | Attending: Gastroenterology

## 2024-04-01 DIAGNOSIS — D122 Benign neoplasm of ascending colon: Secondary | ICD-10-CM | POA: Diagnosis not present

## 2024-04-01 DIAGNOSIS — Z1211 Encounter for screening for malignant neoplasm of colon: Secondary | ICD-10-CM

## 2024-04-01 DIAGNOSIS — I10 Essential (primary) hypertension: Secondary | ICD-10-CM | POA: Diagnosis not present

## 2024-04-01 DIAGNOSIS — K635 Polyp of colon: Secondary | ICD-10-CM | POA: Diagnosis not present

## 2024-04-01 DIAGNOSIS — D126 Benign neoplasm of colon, unspecified: Secondary | ICD-10-CM

## 2024-04-01 HISTORY — PX: COLONOSCOPY: SHX5424

## 2024-04-01 LAB — POCT PREGNANCY, URINE: Preg Test, Ur: NEGATIVE

## 2024-04-01 SURGERY — COLONOSCOPY
Anesthesia: General

## 2024-04-01 MED ORDER — PROPOFOL 10 MG/ML IV BOLUS
INTRAVENOUS | Status: AC
  Filled 2024-04-01: qty 40

## 2024-04-01 MED ORDER — LIDOCAINE HCL (CARDIAC) PF 100 MG/5ML IV SOSY
PREFILLED_SYRINGE | INTRAVENOUS | Status: DC | PRN
  Administered 2024-04-01: 50 mg via INTRAVENOUS

## 2024-04-01 MED ORDER — LIDOCAINE HCL (PF) 2 % IJ SOLN
INTRAMUSCULAR | Status: AC
  Filled 2024-04-01: qty 5

## 2024-04-01 MED ORDER — PROPOFOL 500 MG/50ML IV EMUL
INTRAVENOUS | Status: DC | PRN
  Administered 2024-04-01: 125 ug/kg/min via INTRAVENOUS
  Administered 2024-04-01: 50 mg via INTRAVENOUS

## 2024-04-01 MED ORDER — SODIUM CHLORIDE 0.9 % IV SOLN
INTRAVENOUS | Status: DC
Start: 2024-04-01 — End: 2024-04-01

## 2024-04-01 NOTE — Anesthesia Preprocedure Evaluation (Signed)
 Anesthesia Evaluation  Patient identified by MRN, date of birth, ID band Patient awake    Reviewed: Allergy & Precautions, NPO status , Patient's Chart, lab work & pertinent test results  History of Anesthesia Complications Negative for: history of anesthetic complications  Airway Mallampati: II  TM Distance: >3 FB Neck ROM: full    Dental no notable dental hx.    Pulmonary neg pulmonary ROS   Pulmonary exam normal        Cardiovascular hypertension, On Medications negative cardio ROS Normal cardiovascular exam     Neuro/Psych  PSYCHIATRIC DISORDERS Anxiety     negative neurological ROS     GI/Hepatic negative GI ROS, Neg liver ROS,,,  Endo/Other  negative endocrine ROS    Renal/GU negative Renal ROS  negative genitourinary   Musculoskeletal   Abdominal   Peds  Hematology  (+) Blood dyscrasia, anemia   Anesthesia Other Findings Past Medical History: No date: Fever blister No date: Hypertension No date: Iron deficiency anemia No date: Vitamin D deficiency  Past Surgical History: 11/28/2015: BREAST BIOPSY; Right     Comment:  FIBROADENOMA WITH USUAL DUCTAL HYPERPLASIA 1999: BREAST EXCISIONAL BIOPSY; Left     Comment:  benign No date: ROOT CANAL 01/2017: WISDOM TOOTH EXTRACTION     Comment:  x4  BMI    Body Mass Index: 35.41 kg/m      Reproductive/Obstetrics negative OB ROS                             Anesthesia Physical Anesthesia Plan  ASA: 2  Anesthesia Plan: General   Post-op Pain Management: Minimal or no pain anticipated   Induction: Intravenous  PONV Risk Score and Plan: 2 and Propofol infusion and TIVA  Airway Management Planned: Natural Airway and Nasal Cannula  Additional Equipment:   Intra-op Plan:   Post-operative Plan:   Informed Consent: I have reviewed the patients History and Physical, chart, labs and discussed the procedure including the risks,  benefits and alternatives for the proposed anesthesia with the patient or authorized representative who has indicated his/her understanding and acceptance.     Dental Advisory Given  Plan Discussed with: Anesthesiologist, CRNA and Surgeon  Anesthesia Plan Comments: (Patient consented for risks of anesthesia including but not limited to:  - adverse reactions to medications - risk of airway placement if required - damage to eyes, teeth, lips or other oral mucosa - nerve damage due to positioning  - sore throat or hoarseness - Damage to heart, brain, nerves, lungs, other parts of body or loss of life  Patient voiced understanding and assent.)       Anesthesia Quick Evaluation

## 2024-04-01 NOTE — Op Note (Signed)
 [facility] Patient Name: Martha Mendoza Procedure Date: 04/01/2024 8:50 AM MRN: [id number] Account #: [id number] Date of Birth: [birth year] Admit Type: Outpatient Age: 48 Room: [facility] ENDO ROOM 2 Gender: Female Note Status: Finalized Instrument Name: Prentice Docker [id number] Procedure:             Colonoscopy Indications:           Screening for colorectal malignant neoplasm Providers:             Wyline Mood MD, MD Referring MD:          Marzella Schlein. Bacigalupo (Referring MD) Medicines:             Monitored Anesthesia Care Complications:         No immediate complications. Procedure:             Pre-Anesthesia Assessment:                        - Prior to the procedure, a History and Physical was                         performed, and patient medications, allergies and                         sensitivities were reviewed. The patient's tolerance                         of previous anesthesia was reviewed.                        - The risks and benefits of the procedure and the                         sedation options and risks were discussed with the                         patient. All questions were answered and informed                         consent was obtained.                        - ASA Grade Assessment: II - A patient with mild                         systemic disease.                        After obtaining informed consent, the colonoscope was                         passed under direct vision. Throughout the procedure,                         the patient's blood pressure, pulse, and oxygen                         saturations were monitored continuously. The                         Colonoscope was introduced  through the anus and                         advanced to the the cecum, identified by the                         appendiceal orifice. The colonoscopy was performed                         with ease. The patient tolerated the procedure well.                          The quality of the bowel preparation was excellent.                         The ileocecal valve, appendiceal orifice, and rectum                         were photographed. Findings:      The perianal and digital rectal examinations were normal.      A 3 mm polyp was found in the ascending colon. The polyp was sessile.       The polyp was removed with a jumbo cold forceps. Resection and retrieval       were complete.      The exam was otherwise without abnormality on direct and retroflexion       views. Impression:            - One 3 mm polyp in the ascending colon, removed with                         a jumbo cold forceps. Resected and retrieved.                        - The examination was otherwise normal on direct and                         retroflexion views. Recommendation:        - Discharge patient to home (with escort).                        - Resume previous diet.                        - Continue present medications.                        - Await pathology results.                        - Repeat colonoscopy for surveillance based on                         pathology results. Procedure Code(s):     --- Professional ---                        [phone], Colonoscopy, flexible; with biopsy, single or                         multiple Diagnosis Code(s):     ---  Professional ---                        Z12.11, Encounter for screening for malignant neoplasm                         of colon                        D12.2, Benign neoplasm of ascending colon CPT copyright 2022 American Medical Association. All rights reserved. The codes documented in this report are preliminary and upon coder review may  be revised to meet current compliance requirements. Wyline Mood, MD Wyline Mood MD, MD 04/01/2024 9:10:58 AM This report has been signed electronically. Number of Addenda: 0 Note Initiated On: 04/01/2024 8:50 AM Scope Withdrawal Time: 0 hours 9 minutes 43 seconds   Total Procedure Duration: 0 hours 12 minutes 31 seconds  Estimated Blood Loss:  Estimated blood loss: none.      [facility]

## 2024-04-01 NOTE — H&P (Signed)
 Wyline Mood, MD [address], [place], [place], [address] [address], [place], [place], [address] Phone: [phone]  Fax: [phone]  Primary Care Physician:  Erasmo Downer, MD   Pre-Procedure History & Physical: HPI:  Martha Mendoza is a 48 y.o. female is here for an colonoscopy.   Past Medical History:  Diagnosis Date   Fever blister    Hypertension    Iron deficiency anemia    Vitamin D deficiency     Past Surgical History:  Procedure Laterality Date   BREAST BIOPSY Right 11/28/2015   FIBROADENOMA WITH USUAL DUCTAL HYPERPLASIA   BREAST EXCISIONAL BIOPSY Left 1999   benign   ROOT CANAL     WISDOM TOOTH EXTRACTION  01/2017   x4    Prior to Admission medications   Medication Sig Start Date End Date Taking? Authorizing Provider  losartan-hydrochlorothiazide (HYZAAR) 100-25 MG tablet Take 1 tablet by mouth daily. 01/22/24  Yes Bacigalupo, Marzella Schlein, MD  metoprolol succinate (TOPROL-XL) 25 MG 24 hr tablet Take 1 tablet (25 mg total) by mouth daily. 01/22/24  Yes Erasmo Downer, MD    Allergies as of 03/24/2024 - Review Complete 03/24/2024  Allergen Reaction Noted   Lisinopril Swelling 09/11/2017    Family History  Problem Relation Age of Onset   Breast cancer Mother 2       metastatic   Stroke Mother 10   Hypertension Mother    Diabetes Mother    Breast cancer Maternal Aunt        unknown age   Healthy Father    Hypertension Sister    Cancer Sister    Hypertension Brother    Healthy Brother    Prostate cancer Maternal Uncle    Colon cancer Neg Hx     Social History   Socioeconomic History   Marital status: Divorced    Spouse name: Not on file   Number of children: 2   Years of education: 17   Highest education level: Bachelor's degree (e.g., BA, AB, BS)  Occupational History   Occupation: cytology    Employer: [employer]    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  Tobacco Use   Smoking status: Never   Smokeless  tobacco: Never  Vaping Use   Vaping status: Never Used  Substance and Sexual Activity   Alcohol use: No   Drug use: No   Sexual activity: Not Currently    Partners: Male    Birth control/protection: None  Other Topics Concern   Not on file  Social History Narrative   Not on file   Social Drivers of Health   Financial Resource Strain: Low Risk  (09/24/2023)   Overall Financial Resource Strain (CARDIA)    Difficulty of Paying Living Expenses: Not hard at all  Food Insecurity: No Food Insecurity (09/24/2023)   Hunger Vital Sign    Worried About Running Out of Food in the Last Year: Never true    Ran Out of Food in the Last Year: Never true  Transportation Needs: No Transportation Needs (09/24/2023)   PRAPARE - Administrator, Civil Service (Medical): No    Lack of Transportation (Non-Medical): No  Physical Activity: Insufficiently Active (09/24/2023)   Exercise Vital Sign    Days of Exercise per Week: 2 days    Minutes of Exercise per Session: 60 min  Stress: Stress Concern Present (09/24/2023)   [employer] of Occupational Health - Occupational Stress Questionnaire  Feeling of Stress : To some extent  Social Connections: Moderately Isolated (09/24/2023)   Social Connection and Isolation Panel [NHANES]    Frequency of Communication with Friends and Family: Twice a week    Frequency of Social Gatherings with Friends and Family: Once a week    Attends Religious Services: 1 to 4 times per year    Active Member of [employer] or Organizations: No    Attends Engineer, structural: Not on file    Marital Status: Divorced  Catering manager Violence: Not on file    Review of Systems: See HPI, otherwise negative ROS  Physical Exam: BP (!) 141/90   Pulse 77   Temp (!) 97.2 F (36.2 C) (Temporal)   Resp 16   Wt 85 kg   SpO2 100%   BMI 35.41 kg/m  General:   Alert,  pleasant and cooperative in NAD Head:  Normocephalic and atraumatic. Neck:  Supple; no  masses or thyromegaly. Lungs:  Clear throughout to auscultation, normal respiratory effort.    Heart:  +S1, +S2, Regular rate and rhythm, No edema. Abdomen:  Soft, nontender and nondistended. Normal bowel sounds, without guarding, and without rebound.   Neurologic:  Alert and  oriented x4;  grossly normal neurologically.  Impression/Plan: Martha Mendoza is here for an colonoscopy to be performed for Screening colonoscopy average risk   Risks, benefits, limitations, and alternatives regarding  colonoscopy have been reviewed with the patient.  Questions have been answered.  All parties agreeable.   Wyline Mood, MD  04/01/2024, 7:50 AM

## 2024-04-01 NOTE — Anesthesia Postprocedure Evaluation (Signed)
 Anesthesia Post Note  Patient: Martha Mendoza  Procedure(s) Performed: COLONOSCOPY  Patient location during evaluation: PACU Anesthesia Type: General Level of consciousness: awake and awake and alert Pain management: satisfactory to patient Vital Signs Assessment: post-procedure vital signs reviewed and stable Respiratory status: spontaneous breathing Cardiovascular status: stable Anesthetic complications: no   No notable events documented.   Last Vitals:  Vitals:   04/01/24 0913 04/01/24 0923  BP: 104/66 (!) 152/95  Pulse: 82   Resp: (!) 25   Temp: (!) 36.4 C   SpO2: 100%     Last Pain:  Vitals:   04/01/24 0933  TempSrc:   PainSc: 0-No pain                 VAN STAVEREN,Jaime Dome

## 2024-04-01 NOTE — Transfer of Care (Signed)
 Immediate Anesthesia Transfer of Care Note  Patient: Martha Mendoza  Procedure(s) Performed: COLONOSCOPY  Patient Location: PACU  Anesthesia Type:MAC  Level of Consciousness: sedated  Airway & Oxygen Therapy: Patient Spontanous Breathing  Post-op Assessment: Report given to RN and Post -op Vital signs reviewed and stable  Post vital signs: stable  Last Vitals:  Vitals Value Taken Time  BP 104/66 04/01/24 0913  Temp    Pulse 81 04/01/24 0913  Resp 24 04/01/24 0913  SpO2 99 % 04/01/24 0913  Vitals shown include unfiled device data.  Last Pain:  Vitals:   04/01/24 0715  TempSrc: Temporal         Complications: No notable events documented.

## 2024-04-02 ENCOUNTER — Encounter: Payer: Self-pay | Admitting: Gastroenterology

## 2024-04-02 LAB — SURGICAL PATHOLOGY

## 2024-04-06 ENCOUNTER — Encounter: Payer: Self-pay | Admitting: Gastroenterology

## 2024-04-20 ENCOUNTER — Ambulatory Visit: Admitting: Family Medicine

## 2024-04-20 ENCOUNTER — Other Ambulatory Visit (HOSPITAL_COMMUNITY)
Admission: RE | Admit: 2024-04-20 | Discharge: 2024-04-20 | Disposition: A | Discharge status: Home / Self Care | Source: Ambulatory Visit | Attending: Podiatry | Admitting: Podiatry

## 2024-04-20 VITALS — BP 120/85 | HR 70 | Ht 61.0 in | Wt 192.0 lb

## 2024-04-20 DIAGNOSIS — Z1231 Encounter for screening mammogram for malignant neoplasm of breast: Secondary | ICD-10-CM

## 2024-04-20 DIAGNOSIS — I10 Essential (primary) hypertension: Secondary | ICD-10-CM

## 2024-04-20 DIAGNOSIS — Z1151 Encounter for screening for human papillomavirus (HPV): Secondary | ICD-10-CM | POA: Diagnosis not present

## 2024-04-20 DIAGNOSIS — E782 Mixed hyperlipidemia: Secondary | ICD-10-CM

## 2024-04-20 DIAGNOSIS — Z113 Encounter for screening for infections with a predominantly sexual mode of transmission: Secondary | ICD-10-CM | POA: Insufficient documentation

## 2024-04-20 DIAGNOSIS — Z124 Encounter for screening for malignant neoplasm of cervix: Secondary | ICD-10-CM

## 2024-04-20 DIAGNOSIS — R7303 Prediabetes: Secondary | ICD-10-CM

## 2024-04-20 DIAGNOSIS — B009 Herpesviral infection, unspecified: Secondary | ICD-10-CM

## 2024-04-20 DIAGNOSIS — J301 Allergic rhinitis due to pollen: Secondary | ICD-10-CM

## 2024-04-20 MED ORDER — VALACYCLOVIR HCL 1 G PO TABS: 2000.0000 mg | ORAL_TABLET | Freq: Two times a day (BID) | ORAL | 5 refills | Status: DC

## 2024-04-20 MED ORDER — BENZONATATE 100 MG PO CAPS: 200.0000 mg | ORAL_CAPSULE | Freq: Two times a day (BID) | ORAL | 1 refills | Status: AC | PRN

## 2024-04-20 NOTE — Assessment & Plan Note (Signed)
 Monitoring of prediabetes status. A1c and other labs to be checked to assess current status, including kidney and liver function tests and cholesterol panel. - Order A1c, kidney and liver function tests, and cholesterol panel

## 2024-04-20 NOTE — Progress Notes (Signed)
 Established patient visit   Patient: Martha Mendoza   DOB: [birth year]   48 y.o. Female  MRN: [id number] Visit Date: 04/20/2024  Today's healthcare provider: Aden Agreste, MD   Chief Complaint  Patient presents with   Gynecologic Exam    Last completed 10/11/2015 OK to do STI testing   Medication Refill    Patient would like a refill on valtrex  and tessalon.    Cough    Cough has now stopped but [place]. Previously advised it was due to an allergy to cotton wood and was given tessalon that did work. She travels for work so would like to keep rx available   Subjective    Gynecologic Exam  Medication Refill Associated symptoms include coughing.  Cough   HPI     Gynecologic Exam    Additional comments: Last completed 10/11/2015 OK to do STI testing        Medication Refill    Additional comments: Patient would like a refill on valtrex  and tessalon.         Cough    Additional comments: Cough has now stopped but [place]. Previously advised it was due to an allergy to cotton wood and was given tessalon that did work. She travels for work so would like to keep rx available      Last edited by Pasty Bongo, CMA on 04/20/2024  2:56 PM.       Discussed the use of AI scribe software for clinical note transcription with the patient, who gave verbal consent to proceed.  History of Present Illness   Martha Mendoza is a 48 year old female with hypertension who presents for a follow-up on her blood pressure management and routine Pap smear.  She experiences elevated blood pressure readings in medical settings but generally normal readings at home. A recent reading at the dentist's office was 120/89 mmHg, while at home it was elevated this morning at 167/100 mmHg. She is on metoprolol  25 mg daily and losartan  HCTZ 100/25 mg daily without issues. Discontinuing amlodipine  resolved  her joint pain.  She uses Tessalon for cough, especially when traveling, and Valtrex  for cold sores, which she takes at the onset of symptoms to prevent sores. She recently had a breakout without access to medication. Allergy-related cough and voice loss occur in areas with [facility] but resolve upon returning home.         Medications: Outpatient Medications Prior to Visit  Medication Sig   losartan -hydrochlorothiazide  (HYZAAR) 100-25 MG tablet Take 1 tablet by mouth daily.   metoprolol  succinate (TOPROL -XL) 25 MG 24 hr tablet Take 1 tablet (25 mg total) by mouth daily.   [DISCONTINUED] benzonatate (TESSALON) 100 MG capsule 100 mg 3 (three) times daily as needed for cough.   [DISCONTINUED] Na Sulfate-K Sulfate-Mg Sulfate concentrate (SUPREP) 17.5-3.13-1.6 GM/177ML SOLN Take 1 kit by mouth once.   [DISCONTINUED] valACYclovir  (VALTREX ) 1000 MG tablet Take 1,000 mg by mouth.   No facility-administered medications prior to visit.    Review of Systems  Respiratory:  Positive for cough.         Objective    BP 120/85 Comment: home reading  Pulse 70   Ht 5\' 1"  (1.549 m)   Wt 192 lb (87.1 kg)   LMP 04/07/2024   SpO2 100%   BMI 36.28 kg/m    Physical Exam Vitals reviewed.  Constitutional:  General: She is not in acute distress.    Appearance: Normal appearance. She is well-developed. She is not diaphoretic.  HENT:     Head: Normocephalic and atraumatic.  Eyes:     General: No scleral icterus.    Conjunctiva/sclera: Conjunctivae normal.  Neck:     Thyroid: No thyromegaly.  Cardiovascular:     Rate and Rhythm: Normal rate and regular rhythm.     Heart sounds: Normal heart sounds. No murmur heard. Pulmonary:     Effort: Pulmonary effort is normal. No respiratory distress.     Breath sounds: Normal breath sounds. No wheezing, rhonchi or rales.  Genitourinary:    Comments: GYN:  External genitalia within normal limits.  Vaginal mucosa pink, moist, normal rugae.   Nonfriable cervix without lesions, no discharge or bleeding noted on speculum exam.    Musculoskeletal:     Cervical back: Neck supple.     Right lower leg: No edema.     Left lower leg: No edema.  Lymphadenopathy:     Cervical: No cervical adenopathy.  Skin:    General: Skin is warm and dry.     Findings: No rash.  Neurological:     Mental Status: She is alert and oriented to person, place, and time. Mental status is at baseline.  Psychiatric:        Mood and Affect: Mood normal.        Behavior: Behavior normal.      No results found for any visits on 04/20/24.  Assessment & Plan     Problem List Items Addressed This Visit       Cardiovascular and Mediastinum   Benign essential hypertension - Primary   Relevant Orders   Comprehensive metabolic panel with GFR     Other   Pre-diabetes   Relevant Orders   Hemoglobin A1c   Other Visit Diagnoses       Pap smear for cervical cancer screening       Relevant Orders   Cytology - PAP     Moderate mixed hyperlipidemia not requiring statin therapy       Relevant Orders   Comprehensive metabolic panel with GFR   Lipid panel     Breast cancer screening by mammogram       Relevant Orders   MM 3D SCREENING MAMMOGRAM BILATERAL BREAST           Cervical Cancer and Breast cancer screening  Pap smear to be performed. If results are normal, the interval for the next Pap smear will be five years. Discussion of mammogram scheduling. - Perform Pap smear - Send mammogram order to [facility] facility - Instruct her to call [place] facility to schedule mammogram  Allergic reaction to [facility] Allergic reaction to [facility] noted when traveling to areas where it is present. Symptoms include loss of voice and prolonged cough. Antihistamines like Zyrtec or Claritin recommended before traveling to such areas. Avoid decongestant formulations long-term due to potential blood pressure elevation. 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 She takes 2000 mg at the first sign of a cold sore, which effectively prevents full outbreak. Prescription adjusted to reflect this dosing regimen. - Prescribe valacyclovir  2000 mg twice a day for one day at the first sign of a cold sore - Ensure adequate refills of valacyclovir   Return in about 6 months (around 10/20/2024) for chronic disease f/u.       Aden Agreste, MD  [facility] [phone] (phone) [phone] (fax)  [facility]

## 2024-04-20 NOTE — Assessment & Plan Note (Signed)
 Blood pressure elevated in office setting, likely due to white coat syndrome. Home readings are satisfactory. Current medications include metoprolol  25 mg daily and losartan  HCTZ 100-25 mg daily, which are well-tolerated. - Continue current antihypertensive regimen - Schedule follow-up in 6 months - Document dentist office blood pressure reading in chart

## 2024-04-20 NOTE — Patient Instructions (Signed)
 Call [facility] to schedule a mammogram [phone]

## 2024-04-21 ENCOUNTER — Encounter: Payer: Self-pay | Admitting: Family Medicine

## 2024-04-21 LAB — COMPREHENSIVE METABOLIC PANEL WITH GFR
ALT: 8 IU/L (ref 0–32)
AST: 15 IU/L (ref 0–40)
Albumin: 3.9 g/dL (ref 3.9–4.9)
Alkaline Phosphatase: 58 IU/L (ref 44–121)
BUN/Creatinine Ratio: 12 (ref 9–23)
BUN: 10 mg/dL (ref 6–24)
Bilirubin Total: <0.2 mg/dL (ref 0.0–1.2)
CO2: 21 mmol/L (ref 20–29)
Calcium: 10.1 mg/dL (ref 8.7–10.2)
Chloride: 100 mmol/L (ref 96–106)
Creatinine, Ser: 0.83 mg/dL (ref 0.57–1.00)
Globulin, Total: 4 g/dL (ref 1.5–4.5)
Glucose: 100 mg/dL — ABNORMAL HIGH (ref 70–99)
Potassium: 3.9 mmol/L (ref 3.5–5.2)
Sodium: 138 mmol/L (ref 134–144)
Total Protein: 7.9 g/dL (ref 6.0–8.5)
eGFR: 87 mL/min/{1.73_m2} (ref >59)

## 2024-04-21 LAB — LIPID PANEL
Chol/HDL Ratio: 3.7 ratio (ref 0.0–4.4)
Cholesterol, Total: 198 mg/dL (ref 100–199)
HDL: 54 mg/dL (ref >39)
LDL Chol Calc (NIH): 121 mg/dL — ABNORMAL HIGH (ref 0–99)
Triglycerides: 132 mg/dL (ref 0–149)
VLDL Cholesterol Cal: 23 mg/dL (ref 5–40)

## 2024-04-21 LAB — HEMOGLOBIN A1C
Est. average glucose Bld gHb Est-mCnc: 120 mg/dL
Hgb A1c MFr Bld: 5.8 % — ABNORMAL HIGH (ref 4.8–5.6)

## 2024-04-22 LAB — CYTOLOGY - PAP
Adequacy: ABSENT
Chlamydia: NEGATIVE
Comment: NEGATIVE
Comment: NEGATIVE
Comment: NEGATIVE
Comment: NORMAL
Diagnosis: NEGATIVE
High risk HPV: NEGATIVE
Neisseria Gonorrhea: NEGATIVE
Trichomonas: NEGATIVE

## 2024-10-01 ENCOUNTER — Other Ambulatory Visit: Payer: Self-pay | Admitting: Family Medicine

## 2024-10-01 DIAGNOSIS — I10 Essential (primary) hypertension: Secondary | ICD-10-CM

## 2024-10-01 NOTE — Telephone Encounter (Signed)
 Copied from CRM [id number]. Topic: Clinical - Medication Refill >> Oct 01, 2024  4:35 PM Amy B wrote: Medication:  metoprolol  succinate (TOPROL -XL) 25 MG 24 hr tablet  valACYclovir  (VALTREX ) 1000 MG tablet   losartan -hydrochlorothiazide  (HYZAAR) 100-25 MG tablet   Has the patient contacted their pharmacy? Yes (Agent: If no, request that the patient contact the pharmacy for the refill. If patient does not wish to contact the pharmacy document the reason why and proceed with request.) (Agent: If yes, when and what did the pharmacy advise?)  This is the patient's preferred pharmacy:  [facility] [address] [place] - [address] [place] [address] Phone: [phone] Fax: [phone]  Is this the correct pharmacy for this prescription? Yes If no, delete pharmacy and type the correct one.   Has the prescription been filled recently? No  Is the patient out of the medication? No  Has the patient been seen for an appointment in the last year OR does the patient have an upcoming appointment? Yes  Can we respond through MyChart? Yes  Agent: Please be advised that Rx refills may take up to 3 business days. We ask that you follow-up with your pharmacy.

## 2024-10-05 MED ORDER — LOSARTAN POTASSIUM-HCTZ 100-25 MG PO TABS: 1.0000 | ORAL_TABLET | Freq: Every day | ORAL | 0 refills | Status: DC

## 2024-10-05 MED ORDER — METOPROLOL SUCCINATE ER 25 MG PO TB24: 25.0000 mg | ORAL_TABLET | Freq: Every day | ORAL | 0 refills | Status: DC

## 2024-10-05 MED ORDER — VALACYCLOVIR HCL 1 G PO TABS: 2000.0000 mg | ORAL_TABLET | Freq: Two times a day (BID) | ORAL | 0 refills | Status: AC

## 2024-10-05 NOTE — Telephone Encounter (Signed)
 Requested Prescriptions  Pending Prescriptions Disp Refills   valACYclovir  (VALTREX ) 1000 MG tablet 4 tablet 0    Sig: Take 2 tablets (2,000 mg total) by mouth 2 (two) times daily. For 1 day at first sign of cold sore     Antimicrobials:  Antiviral Agents - Anti-Herpetic Passed - 10/05/2024  9:42 AM      Passed - Valid encounter within last 12 months    Recent Outpatient Visits           5 months ago Benign essential hypertension   [facility] [place], Jon HERO, MD               metoprolol  succinate (TOPROL -XL) 25 MG 24 hr tablet 90 tablet 0    Sig: Take 1 tablet (25 mg total) by mouth daily.     Cardiovascular:  Beta Blockers Passed - 10/05/2024  9:42 AM      Passed - Last BP in normal range    BP Readings from Last 1 Encounters:  04/20/24 120/85         Passed - Last Heart Rate in normal range    Pulse Readings from Last 1 Encounters:  04/20/24 70         Passed - Valid encounter within last 6 months    Recent Outpatient Visits           5 months ago Benign essential hypertension   [facility] [place], Jon HERO, MD               losartan -hydrochlorothiazide  (HYZAAR) 100-25 MG tablet 90 tablet 0    Sig: Take 1 tablet by mouth daily.     Cardiovascular: ARB + Diuretic Combos Passed - 10/05/2024  9:42 AM      Passed - K in normal range and within 180 days    Potassium  Date Value Ref Range Status  04/20/2024 3.9 3.5 - 5.2 mmol/L Final         Passed - Na in normal range and within 180 days    Sodium  Date Value Ref Range Status  04/20/2024 138 134 - 144 mmol/L Final         Passed - Cr in normal range and within 180 days    Creatinine, Ser  Date Value Ref Range Status  04/20/2024 0.83 0.57 - 1.00 mg/dL Final         Passed - eGFR is 10 or above and within 180 days    GFR calc Af Amer  Date Value Ref Range Status  02/13/2021 92 >59 mL/min/1.73 Final    Comment:    **In accordance with  recommendations from the NKF-ASN Task force,**   [facility] is in the process of updating its eGFR calculation to the   2021 CKD-EPI creatinine equation that estimates kidney function   without a race variable.    GFR calc non Af Amer  Date Value Ref Range Status  02/13/2021 80 >59 mL/min/1.73 Final   eGFR  Date Value Ref Range Status  04/20/2024 87 >59 mL/min/1.73 Final         Passed - Patient is not pregnant      Passed - Last BP in normal range    BP Readings from Last 1 Encounters:  04/20/24 120/85         Passed - Valid encounter within last 6 months    Recent Outpatient Visits  5 months ago Benign essential hypertension   [facility] [place], Jon HERO, MD

## 2024-10-25 ENCOUNTER — Ambulatory Visit: Admitting: Family Medicine

## 2024-10-25 VITALS — BP 132/90 | HR 71 | Ht 61.0 in | Wt 199.6 lb

## 2024-10-25 DIAGNOSIS — E782 Mixed hyperlipidemia: Secondary | ICD-10-CM | POA: Diagnosis not present

## 2024-10-25 DIAGNOSIS — I10 Essential (primary) hypertension: Secondary | ICD-10-CM

## 2024-10-25 DIAGNOSIS — R7303 Prediabetes: Secondary | ICD-10-CM

## 2024-10-25 DIAGNOSIS — E559 Vitamin D deficiency, unspecified: Secondary | ICD-10-CM | POA: Diagnosis not present

## 2024-10-25 DIAGNOSIS — D508 Other iron deficiency anemias: Secondary | ICD-10-CM

## 2024-10-25 DIAGNOSIS — K141 Geographic tongue: Secondary | ICD-10-CM

## 2024-10-25 MED ORDER — METOPROLOL SUCCINATE ER 50 MG PO TB24: 50.0000 mg | ORAL_TABLET | Freq: Every day | ORAL | 1 refills | Status: AC

## 2024-10-25 NOTE — Assessment & Plan Note (Signed)
 Reports excessive thirst and urination, possibly related to elevated blood sugar or diuretic use. Previous blood sugar was 5.8, indicating prediabetes. Need to reassess blood sugar levels. - Ordered blood work to assess current blood sugar levels.

## 2024-10-25 NOTE — Assessment & Plan Note (Signed)
 BMI 37 and assoc with HLD, HTN Discussed importance of healthy weight management Discussed diet and exercise

## 2024-10-25 NOTE — Assessment & Plan Note (Signed)
 Blood pressure elevated with home readings averaging 130s/90s. Current medications include losartan  and hydrochlorothiazide  at maximum doses. Metoprolol  dose can be increased as pulse is not low. Goal is to achieve blood pressure in the 120s/70s-80s range to protect kidneys and heart. - Increased metoprolol  to 50 mg once daily. - Scheduled follow-up appointment in one month to assess blood pressure control, with option for virtual visit.

## 2024-10-25 NOTE — Progress Notes (Signed)
 Established patient visit   Patient: Martha Mendoza   DOB: 02-Dec-1976   48 y.o. Female  MRN: [id number] Visit Date: 10/25/2024  Today's healthcare provider: Jon Eva, MD   Chief Complaint  Patient presents with   Medical Management of Chronic Issues   Hypertension    Patient reports monitoring at home with avg reading of 132/90. She does not smoke and consuming a regular diet. Not taking anything that can affect readings. She reports some knee pain and woozy sometimes.    Subjective    Hypertension   HPI     Hypertension    Additional comments: Patient reports monitoring at home with avg reading of 132/90. She does not smoke and consuming a regular diet. Not taking anything that can affect readings. She reports some knee pain and woozy sometimes.       Last edited by Lilian Fitzpatrick, CMA on 10/25/2024  9:39 AM.       Discussed the use of AI scribe software for clinical note transcription with the patient, who gave verbal consent to proceed.  History of Present Illness   Martha Mendoza is a 48 year old female with hypertension and prediabetes who presents for routine follow-up.  She has a swollen lymph node on the right side of her neck, intermittently present for months, with no association with colds or cold sores and no systemic symptoms. Geographic tongue causes discomfort with salty, spicy, and acidic foods. Excessive thirst and frequent urination are present, initially attributed to medication. Her previous blood sugar level was 5.8. Blood pressure readings at home average in the 130s/90s, with higher readings attributed to travel stress. She takes losartan , hydrochlorothiazide , and metoprolol . Joint pain affects her knee and foot on one side, with concerns about medication-related causes. She recently traveled from [place] , arriving at 3 AM, and plans to rest after her appointment.         Medications: Outpatient Medications Prior to Visit  Medication Sig    benzonatate  (TESSALON ) 100 MG capsule Take 2 capsules (200 mg total) by mouth 2 (two) times daily as needed for cough.   losartan -hydrochlorothiazide  (HYZAAR) 100-25 MG tablet Take 1 tablet by mouth daily.   valACYclovir  (VALTREX ) 1000 MG tablet Take 2 tablets (2,000 mg total) by mouth 2 (two) times daily. For 1 day at first sign of cold sore   [DISCONTINUED] metoprolol  succinate (TOPROL -XL) 25 MG 24 hr tablet Take 1 tablet (25 mg total) by mouth daily.   No facility-administered medications prior to visit.    Review of Systems     Objective    BP (!) 132/90 Comment: home reading  Pulse 71   Ht 5' 1 (1.549 m)   Wt 199 lb 9.6 oz (90.5 kg)   SpO2 99%   BMI 37.71 kg/m    Physical Exam Vitals reviewed.  Constitutional:      General: She is not in acute distress.    Appearance: Normal appearance. She is well-developed. She is not diaphoretic.  HENT:     Head: Normocephalic and atraumatic.  Eyes:     General: No scleral icterus.    Conjunctiva/sclera: Conjunctivae normal.  Neck:     Thyroid: No thyromegaly.  Cardiovascular:     Rate and Rhythm: Normal rate and regular rhythm.     Heart sounds: Normal heart sounds. No murmur heard. Pulmonary:     Effort: Pulmonary effort is normal. No respiratory distress.     Breath sounds: Normal breath sounds. No wheezing, rhonchi  or rales.  Musculoskeletal:     Cervical back: Neck supple.     Right lower leg: No edema.     Left lower leg: No edema.  Lymphadenopathy:     Cervical: No cervical adenopathy.  Skin:    General: Skin is warm and dry.     Findings: No rash.  Neurological:     Mental Status: She is alert and oriented to person, place, and time. Mental status is at baseline.  Psychiatric:        Mood and Affect: Mood normal.        Behavior: Behavior normal.      No results found for any visits on 10/25/24.  Assessment & Plan     Problem List Items Addressed This Visit       Cardiovascular and Mediastinum    Benign essential hypertension - Primary   Blood pressure elevated with home readings averaging 130s/90s. Current medications include losartan  and hydrochlorothiazide  at maximum doses. Metoprolol  dose can be increased as pulse is not low. Goal is to achieve blood pressure in the 120s/70s-80s range to protect kidneys and heart. - Increased metoprolol  to 50 mg once daily. - Scheduled follow-up appointment in one month to assess blood pressure control, with option for virtual visit.      Relevant Medications   metoprolol  succinate (TOPROL -XL) 50 MG 24 hr tablet   Other Relevant Orders   Comprehensive metabolic panel with GFR   Microalbumin / creatinine urine ratio     Other   Vitamin D  deficiency   Routine monitoring of vitamin D  levels is necessary. - Ordered blood work to assess vitamin D  levels.      Relevant Orders   VITAMIN D  25 Hydroxy (Vit-D Deficiency, Fractures)   Iron deficiency anemia   Routine monitoring of iron levels is necessary. - Ordered blood work to assess iron levels.      Relevant Orders   CBC w/Diff/Platelet   Iron, TIBC and Ferritin Panel   Morbid obesity (HCC)   BMI 37 and assoc with HLD, HTN Discussed importance of healthy weight management Discussed diet and exercise       Pre-diabetes   Reports excessive thirst and urination, possibly related to elevated blood sugar or diuretic use. Previous blood sugar was 5.8, indicating prediabetes. Need to reassess blood sugar levels. - Ordered blood work to assess current blood sugar levels.      Relevant Orders   Hemoglobin A1c   Moderate mixed hyperlipidemia not requiring statin therapy   Routine monitoring of cholesterol levels is necessary. - Ordered blood work to assess cholesterol levels.      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 Geographic tongue is a benign condition with no harmful effects but can be uncomfortable. Lymph nodes are likely reactive due to inflammation from geographic tongue. - Continue to monitor lymph nodes for changes.        Return in about 4 weeks (around 11/22/2024) for BP f/u, virtual ok.       Jon Eva, MD  [facility] [phone] (phone) [phone] (fax)  [facility]

## 2024-10-25 NOTE — Assessment & Plan Note (Signed)
 Routine monitoring of iron levels is necessary. - Ordered blood work to assess iron levels.

## 2024-10-25 NOTE — Assessment & Plan Note (Signed)
 Routine monitoring of vitamin D  levels is necessary. - Ordered blood work to assess vitamin D  levels.

## 2024-10-25 NOTE — Assessment & Plan Note (Signed)
 Routine monitoring of cholesterol levels is necessary. - Ordered blood work to assess cholesterol levels.

## 2024-10-26 ENCOUNTER — Ambulatory Visit: Payer: Self-pay | Admitting: Family Medicine

## 2024-10-26 LAB — CBC WITH DIFFERENTIAL/PLATELET
Basophils Absolute: 0 x10E3/uL (ref 0.0–0.2)
Basos: 1 %
EOS (ABSOLUTE): 0.1 x10E3/uL (ref 0.0–0.4)
Eos: 2 %
Hematocrit: 39.5 % (ref 34.0–46.6)
Hemoglobin: 11.8 g/dL (ref 11.1–15.9)
Immature Grans (Abs): 0 x10E3/uL (ref 0.0–0.1)
Immature Granulocytes: 0 %
Lymphocytes Absolute: 1.7 x10E3/uL (ref 0.7–3.1)
Lymphs: 35 %
MCH: 20.3 pg — ABNORMAL LOW (ref 26.6–33.0)
MCHC: 29.9 g/dL — ABNORMAL LOW (ref 31.5–35.7)
MCV: 68 fL — ABNORMAL LOW (ref 79–97)
Monocytes Absolute: 0.4 x10E3/uL (ref 0.1–0.9)
Monocytes: 8 %
Neutrophils Absolute: 2.6 x10E3/uL (ref 1.4–7.0)
Neutrophils: 54 %
Platelets: 342 x10E3/uL (ref 150–450)
RBC: 5.82 x10E6/uL — ABNORMAL HIGH (ref 3.77–5.28)
RDW: 15.4 % (ref 11.7–15.4)
WBC: 4.9 x10E3/uL (ref 3.4–10.8)

## 2024-10-26 LAB — LIPID PANEL
Chol/HDL Ratio: 3.4 ratio (ref 0.0–4.4)
Cholesterol, Total: 200 mg/dL — ABNORMAL HIGH (ref 100–199)
HDL: 59 mg/dL (ref >39)
LDL Chol Calc (NIH): 132 mg/dL — ABNORMAL HIGH (ref 0–99)
Triglycerides: 50 mg/dL (ref 0–149)
VLDL Cholesterol Cal: 9 mg/dL (ref 5–40)

## 2024-10-26 LAB — COMPREHENSIVE METABOLIC PANEL WITH GFR
ALT: 7 IU/L (ref 0–32)
AST: 15 IU/L (ref 0–40)
Albumin: 4.2 g/dL (ref 3.9–4.9)
Alkaline Phosphatase: 70 IU/L (ref 41–116)
BUN/Creatinine Ratio: 8 — ABNORMAL LOW (ref 9–23)
BUN: 8 mg/dL (ref 6–24)
Bilirubin Total: 0.4 mg/dL (ref 0.0–1.2)
CO2: 23 mmol/L (ref 20–29)
Calcium: 9.6 mg/dL (ref 8.7–10.2)
Chloride: 102 mmol/L (ref 96–106)
Creatinine, Ser: 0.95 mg/dL (ref 0.57–1.00)
Globulin, Total: 4.2 g/dL (ref 1.5–4.5)
Glucose: 104 mg/dL — ABNORMAL HIGH (ref 70–99)
Potassium: 4.3 mmol/L (ref 3.5–5.2)
Sodium: 136 mmol/L (ref 134–144)
Total Protein: 8.4 g/dL (ref 6.0–8.5)
eGFR: 73 mL/min/1.73 (ref >59)

## 2024-10-26 LAB — MICROALBUMIN / CREATININE URINE RATIO
Creatinine, Urine: 38 mg/dL
Microalb/Creat Ratio: 26 mg/g{creat} (ref 0–29)
Microalbumin, Urine: 9.8 ug/mL

## 2024-10-26 LAB — IRON,TIBC AND FERRITIN PANEL
Ferritin: 34 ng/mL (ref 15–150)
Iron Saturation: 14 % — ABNORMAL LOW (ref 15–55)
Iron: 45 ug/dL (ref 27–159)
Total Iron Binding Capacity: 320 ug/dL (ref 250–450)
UIBC: 275 ug/dL (ref 131–425)

## 2024-10-26 LAB — HEMOGLOBIN A1C
Est. average glucose Bld gHb Est-mCnc: 123 mg/dL
Hgb A1c MFr Bld: 5.9 % — ABNORMAL HIGH (ref 4.8–5.6)

## 2024-10-26 LAB — VITAMIN D 25 HYDROXY (VIT D DEFICIENCY, FRACTURES): Vit D, 25-Hydroxy: 27.6 ng/mL — ABNORMAL LOW (ref 30.0–100.0)

## 2024-12-07 ENCOUNTER — Ambulatory Visit: Admitting: Family Medicine

## 2025-01-04 ENCOUNTER — Encounter: Payer: Self-pay | Admitting: Family Medicine

## 2025-01-04 ENCOUNTER — Telehealth: Admitting: Family Medicine

## 2025-01-04 VITALS — BP 150/90

## 2025-01-04 DIAGNOSIS — H43399 Other vitreous opacities, unspecified eye: Secondary | ICD-10-CM

## 2025-01-04 DIAGNOSIS — I10 Essential (primary) hypertension: Secondary | ICD-10-CM | POA: Diagnosis not present

## 2025-01-04 NOTE — Assessment & Plan Note (Signed)
 Hypertension initially well-controlled with losartan , hydrochlorothiazide , and increased metoprolol . Recent increase in blood pressure likely due to dietary changes (high sodium intake from ham) and stress during the holidays. Current blood pressure is 150/94 mmHg, improved from previous 165/105 mmHg but higher than pre-holiday levels. Plan to monitor blood pressure and lifestyle changes before considering further medication adjustments. - Continue current medications: losartan , hydrochlorothiazide , and metoprolol . - Monitor blood pressure at home. - Reduce sodium intake, particularly from ham. - Will schedule follow-up appointment in 1-1.5 months to reassess blood pressure control.

## 2025-01-04 NOTE — Progress Notes (Signed)
 "   MyChart Video Visit    Virtual Visit via Video Note   This format is felt to be most appropriate for this patient at this time. Physical exam was limited by quality of the video and audio technology used for the visit.    Patient location: home Provider location: [facility] Persons involved in the visit: patient, provider  I discussed the limitations of evaluation and management by telemedicine and the availability of in person appointments. The patient expressed understanding and agreed to proceed.  Patient: Martha Mendoza   DOB: 17-Nov-1976   49 y.o. Female  MRN: [id number] Visit Date: 01/04/2025  Today's healthcare provider: Jon Eva, MD   No chief complaint on file.  Subjective    HPI   Discussed the use of AI scribe software for clinical note transcription with the patient, who gave verbal consent to proceed.  History of Present Illness   Martha Mendoza is a 49 year old female with hypertension who presents for follow-up on blood pressure management.  Two months ago she was on losartan  100 mg daily, hydrochlorothiazide  25 mg daily, and metoprolol  25 mg daily, with home blood pressures in the 130s/90s. Metoprolol  was increased to 50 mg daily at that visit for better control.  After the metoprolol  increase, her blood pressure improved to lows around 112/65, but during the holidays it rose as high as 165/105, which she relates to high-sodium foods, especially ham.  Recently her blood pressure has come down from the peak but remains elevated, with home readings around 150/94 and persistently high over the past 2 to 3 days.  She also reports a new floater in one eye described as a weird thing. She denies other new symptoms and otherwise feels well.        Review of Systems      Objective    BP (!) 150/90 Comment: home reading     Physical Exam Constitutional:      General: She is not in acute distress.    Appearance: Normal appearance.   HENT:     Head: Normocephalic.  Pulmonary:     Effort: Pulmonary effort is normal. No respiratory distress.  Neurological:     Mental Status: She is alert and oriented to person, place, and time. Mental status is at baseline.        Assessment & Plan     Problem List Items Addressed This Visit       Cardiovascular and Mediastinum   Benign essential hypertension - Primary   Hypertension initially well-controlled with losartan , hydrochlorothiazide , and increased metoprolol . Recent increase in blood pressure likely due to dietary changes (high sodium intake from ham) and stress during the holidays. Current blood pressure is 150/94 mmHg, improved from previous 165/105 mmHg but higher than pre-holiday levels. Plan to monitor blood pressure and lifestyle changes before considering further medication adjustments. - Continue current medications: losartan , hydrochlorothiazide , and metoprolol . - Monitor blood pressure at home. - Reduce sodium intake, particularly from ham. - Will schedule follow-up appointment in 1-1.5 months to reassess blood pressure control.      Other Visit Diagnoses       Vitreous floaters, unspecified laterality           Floaters are likely benign - discussed change in vitreous with aging. Advised to see her eye doctor for exam.   No orders of the defined types were placed in this encounter.    Return in about 6 weeks (around 02/15/2025) for BP f/u, virtual  ok.     I discussed the assessment and treatment plan with the patient. The patient was provided an opportunity to ask questions and all were answered. The patient agreed with the plan and demonstrated an understanding of the instructions.   The patient was advised to call back or seek an in-person evaluation if the symptoms worsen or if the condition fails to improve as anticipated.   Jon Eva, MD [facility] [phone] (phone) [phone] (fax)  [facility]   "

## 2025-01-19 ENCOUNTER — Telehealth: Payer: Self-pay

## 2025-01-19 ENCOUNTER — Telehealth: Payer: Self-pay | Admitting: Family Medicine

## 2025-01-19 NOTE — Telephone Encounter (Addendum)
 Pt dropped off [employer] for employment letter form to be completed.  She would like it faxed when complete and a copy left at the front desk.  Placed in Dr. Rona box.  Please let pt know when complete and ready for p/u.

## 2025-01-20 NOTE — Telephone Encounter (Signed)
 Forms copmpleted. Patient advised. Forms placed on sorter to be faxed and scanned in. Original placed in sorter for patient to pick up as requested.

## 2025-01-20 NOTE — Telephone Encounter (Signed)
 Needs an appt for assessment for clearance. Sha has form and will call patient.

## 2025-01-26 ENCOUNTER — Other Ambulatory Visit: Payer: Self-pay | Admitting: Family Medicine

## 2025-01-26 DIAGNOSIS — I10 Essential (primary) hypertension: Secondary | ICD-10-CM

## 2025-02-24 ENCOUNTER — Ambulatory Visit: Admitting: Family Medicine
